# Patient Record
Sex: Female | Born: 1965 | Race: Black or African American | Hispanic: No | Marital: Married | Smoking: Current every day smoker
Health system: Southern US, Community
[De-identification: ages and names within clinical notes are randomized; demographics above are authoritative.]

---

## 2000-09-12 ENCOUNTER — Other Ambulatory Visit: Admission: RE | Admit: 2000-09-12 | Discharge: 2000-09-12 | Payer: Self-pay | Admitting: *Deleted

## 2002-01-04 ENCOUNTER — Encounter: Admission: RE | Admit: 2002-01-04 | Discharge: 2002-01-04 | Payer: Self-pay

## 2002-10-10 ENCOUNTER — Other Ambulatory Visit: Admission: RE | Admit: 2002-10-10 | Discharge: 2002-10-10 | Payer: Self-pay

## 2004-08-19 ENCOUNTER — Other Ambulatory Visit: Admission: RE | Admit: 2004-08-19 | Discharge: 2004-08-19 | Payer: Self-pay | Admitting: Family Medicine

## 2005-08-24 ENCOUNTER — Other Ambulatory Visit: Admission: RE | Admit: 2005-08-24 | Discharge: 2005-08-24 | Payer: Self-pay | Admitting: Family Medicine

## 2006-01-12 ENCOUNTER — Emergency Department (HOSPITAL_COMMUNITY): Admission: EM | Admit: 2006-01-12 | Discharge: 2006-01-12 | Payer: Self-pay | Admitting: Emergency Medicine

## 2006-08-12 ENCOUNTER — Emergency Department (HOSPITAL_COMMUNITY): Admission: EM | Admit: 2006-08-12 | Discharge: 2006-08-12 | Payer: Self-pay | Admitting: Emergency Medicine

## 2006-08-12 IMAGING — CR DG FINGER MIDDLE 2+V*R*
3 series · 3 of 3 positions shown · non-contrast
Comparison: none

CLINICAL DATA: Blunt trauma.  
 RIGHT THIRD DIGIT ? 3 VIEW:

[x finger pa right]
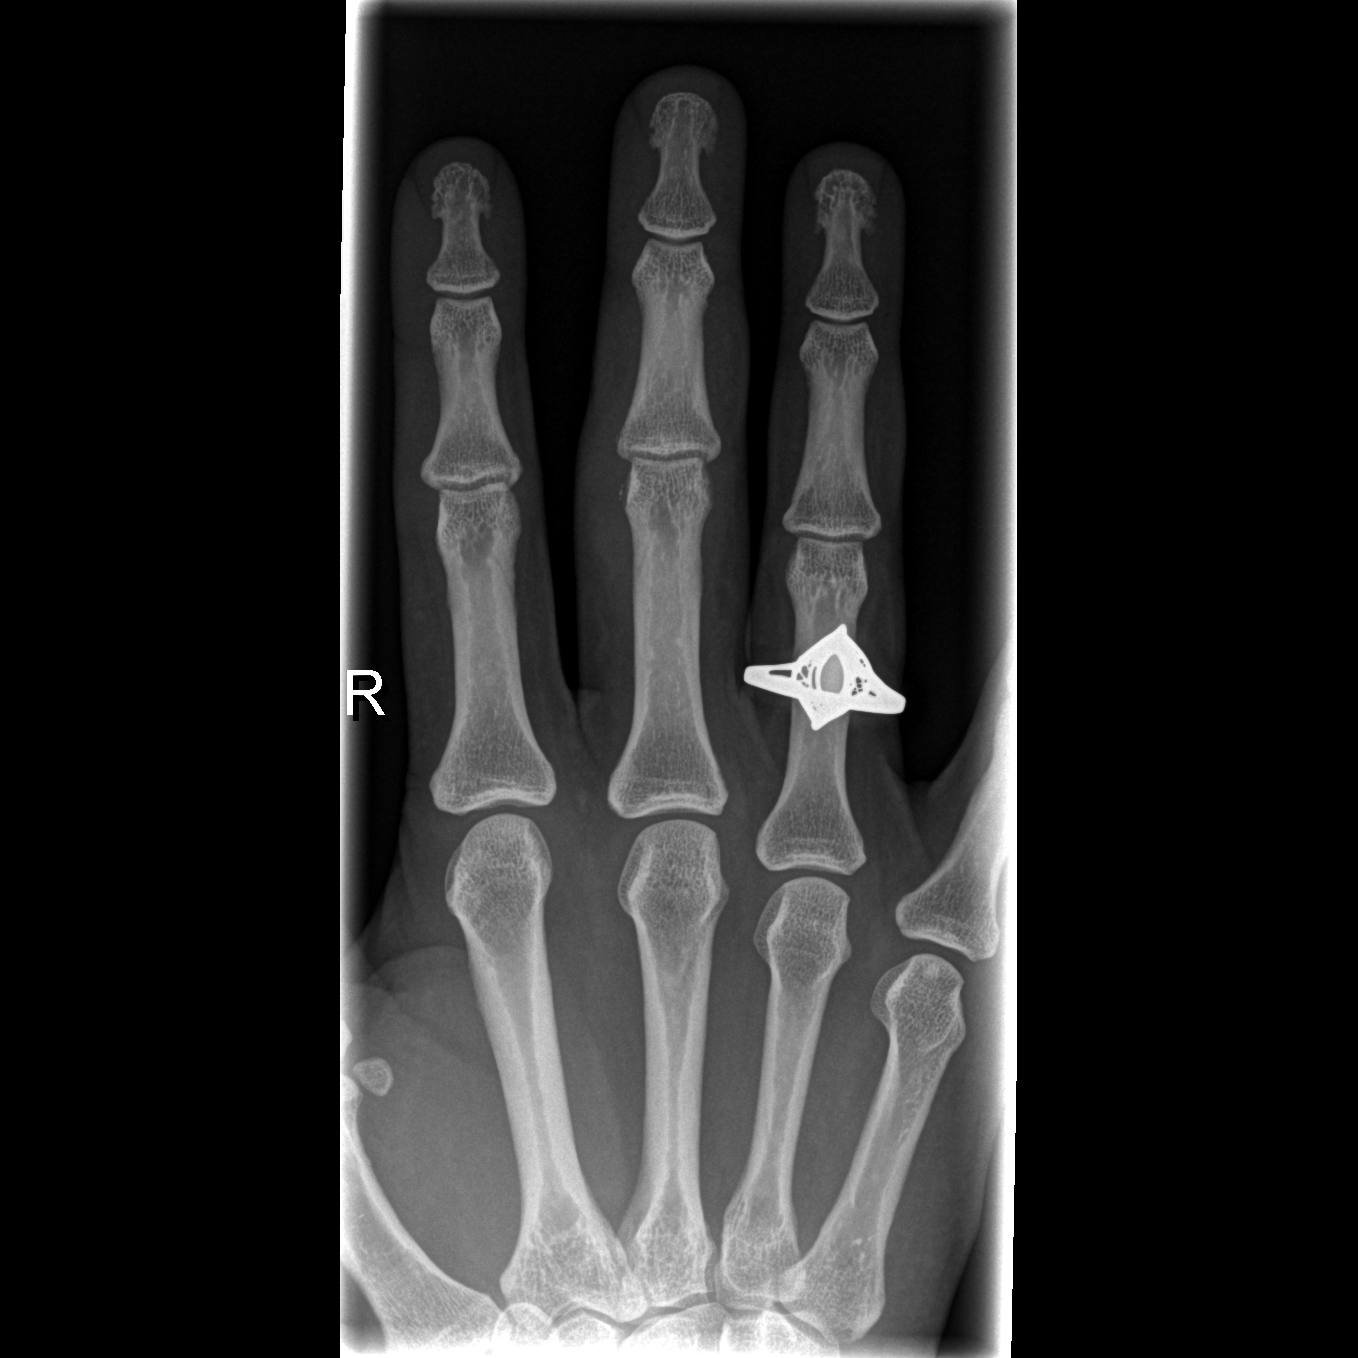

[x finger obl. right]
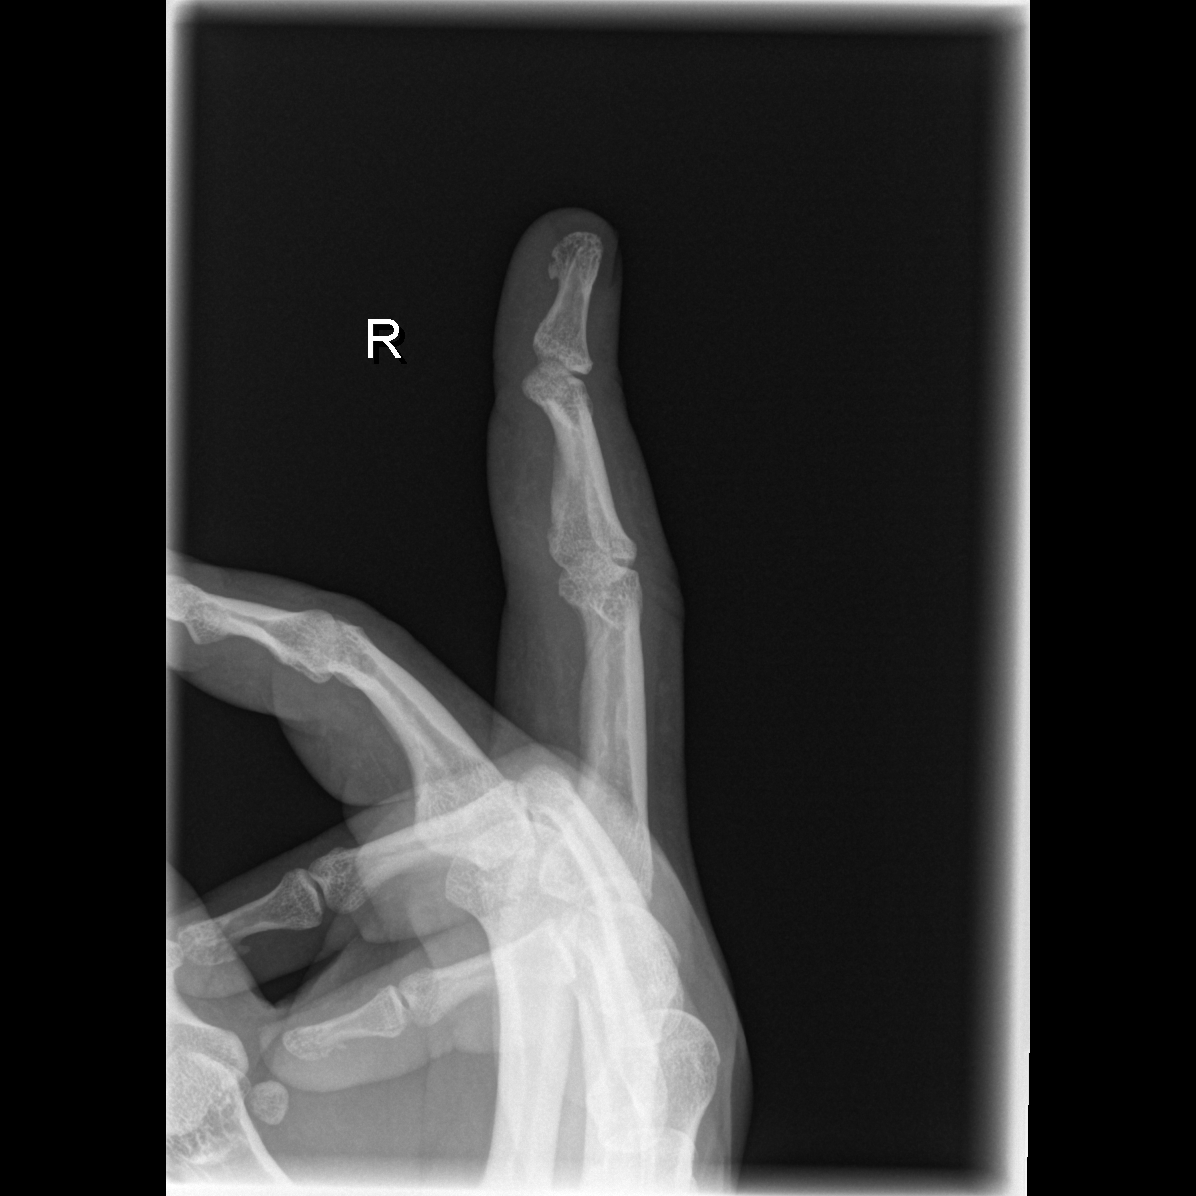

[x finger lateral right]
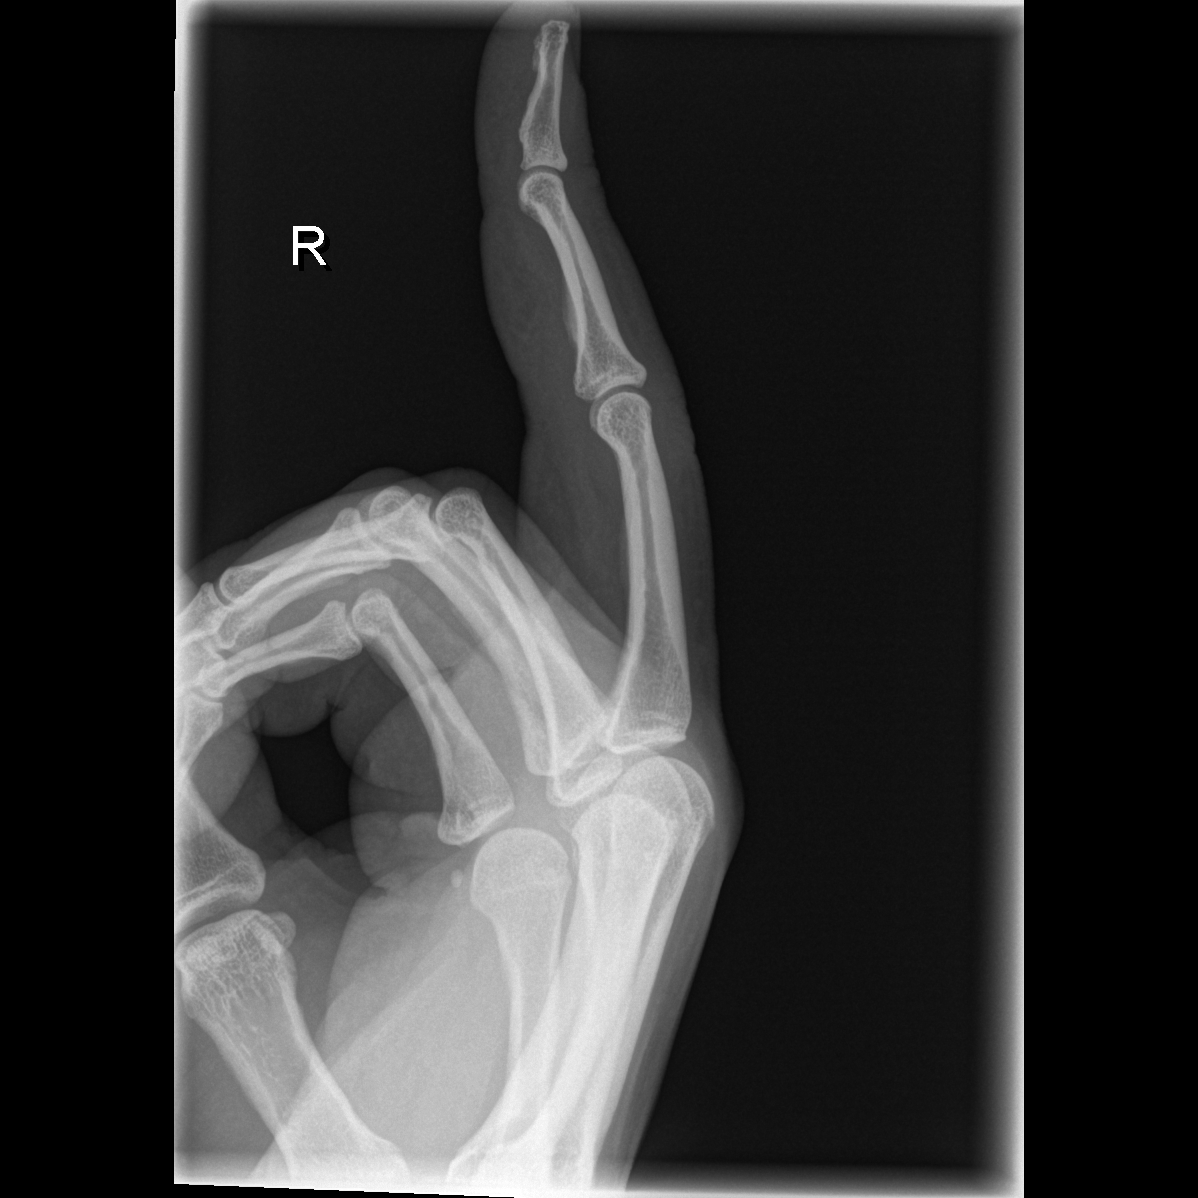

[3 of 3 positions shown; findings below may reference images not displayed]

FINDINGS: No acute fracture is seen.  Alignment is normal.
IMPRESSION: No fracture.

## 2007-09-08 ENCOUNTER — Emergency Department (HOSPITAL_COMMUNITY): Admission: EM | Admit: 2007-09-08 | Discharge: 2007-09-08 | Payer: Self-pay | Admitting: Emergency Medicine

## 2007-10-05 ENCOUNTER — Emergency Department (HOSPITAL_COMMUNITY): Admission: EM | Admit: 2007-10-05 | Discharge: 2007-10-06 | Payer: Self-pay | Admitting: Emergency Medicine

## 2007-10-06 IMAGING — CT CT ABDOMEN W/ CM
2 of 5 series · 17 of 46 positions shown, 19 images · IV contrast (APPLIED)
Comparison: None.

CT ABDOMEN

CLINICAL DATA: Right lower quadrant abdominal pain, nausea,
vomiting and diarrhea for the past 2 days.  History of bilateral
ovarian cysts.

CT ABDOMEN AND PELVIS WITH CONTRAST
TECHNIQUE: Multidetector CT imaging of the abdomen and pelvis was
performed using the standard protocol following bolus
administration of intravenous contrast.
Contrast: 80 ml [id number]

[Series 3: abd/pelv with 5.0 b31f st · axial · 0.83mm/px · z∈[+54,+464]mm · 14 of 92 slices shown, 16 images]
[im 5/92  soft-tissue]
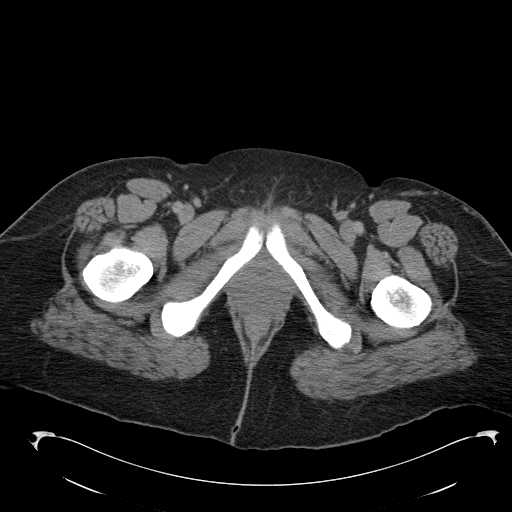
[im 5/92  bone]
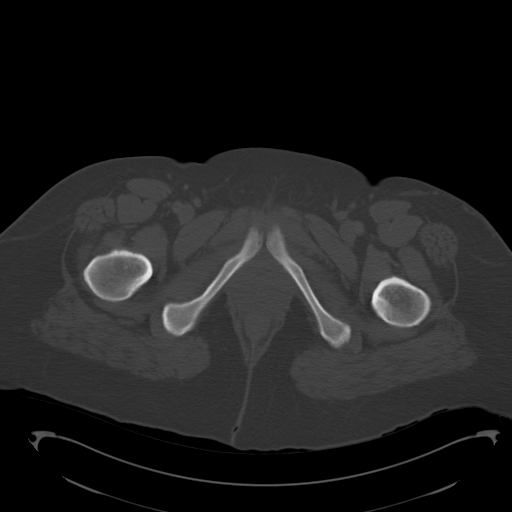
[im 10/92  soft-tissue]
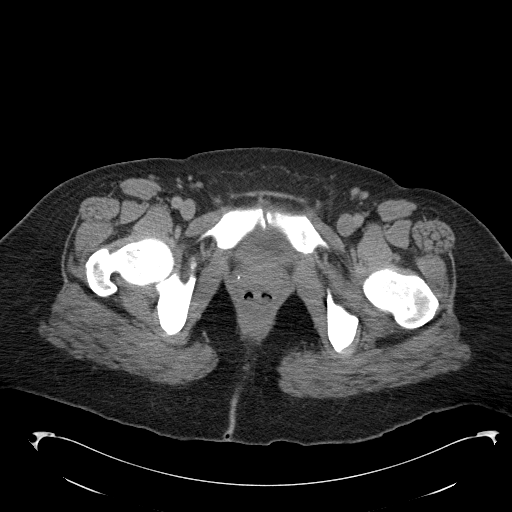
[im 20/92  soft-tissue]
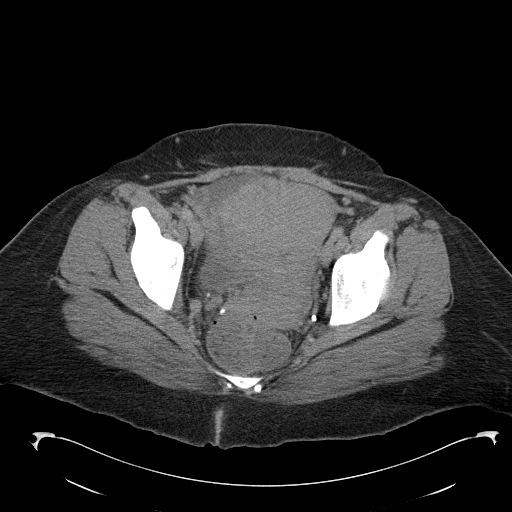
[im 24/92  soft-tissue]
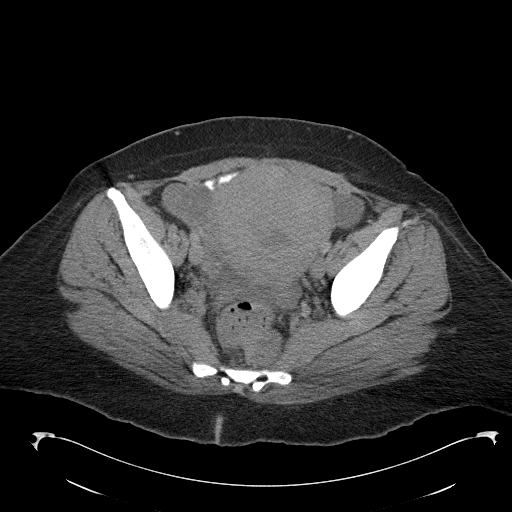
[im 29/92  soft-tissue]
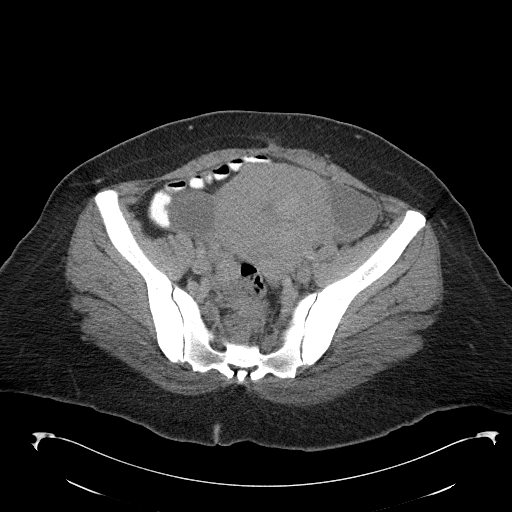
[im 39/92  soft-tissue]
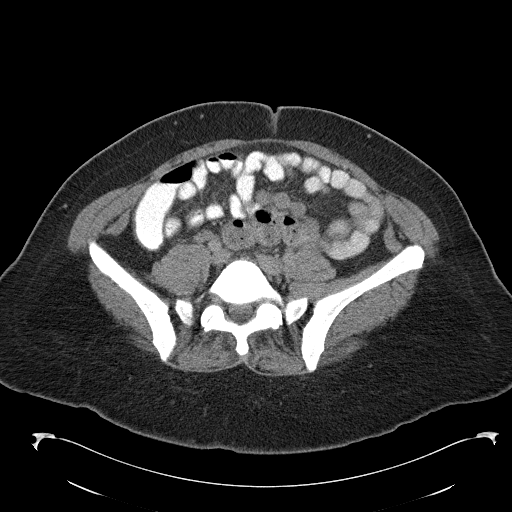
[im 44/92  soft-tissue]
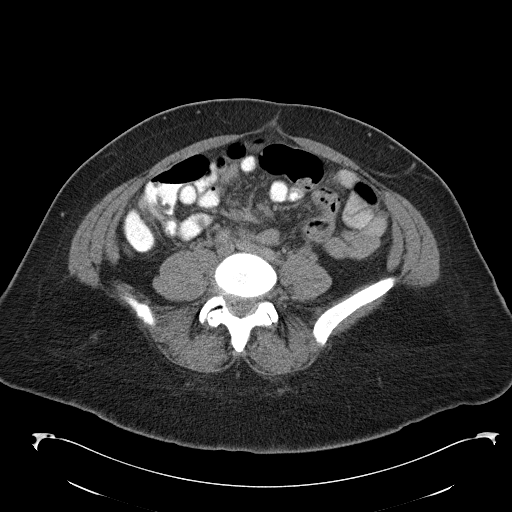
[im 48/92  soft-tissue]
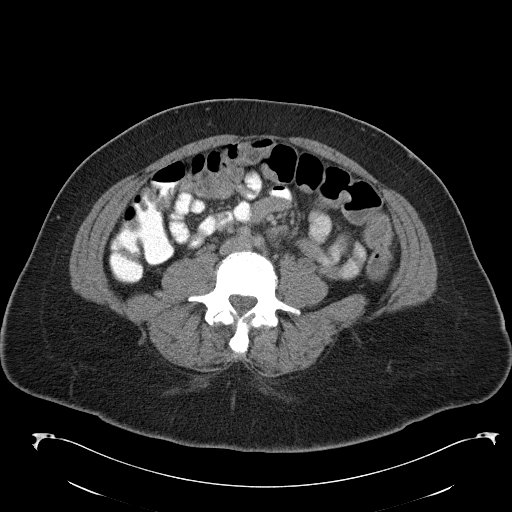
[im 53/92  soft-tissue]
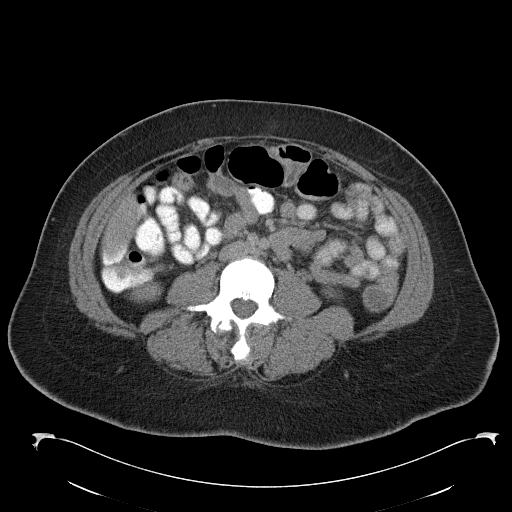
[im 53/92  bone]
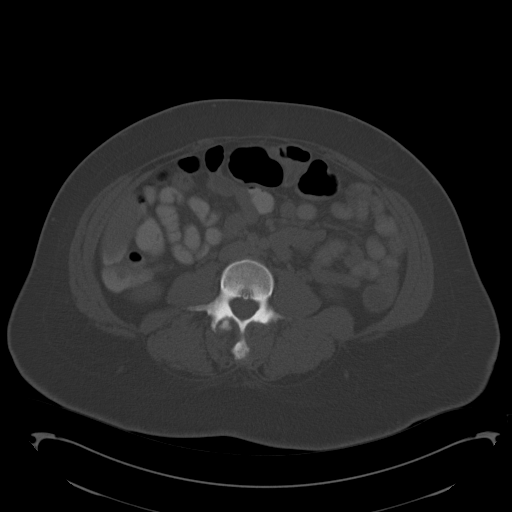
[im 63/92  soft-tissue]
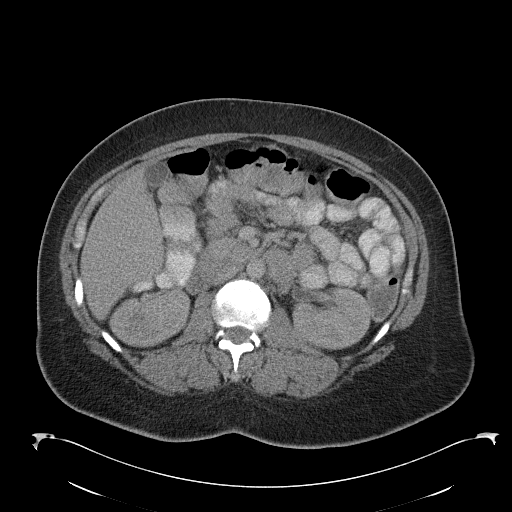
[im 68/92  soft-tissue]
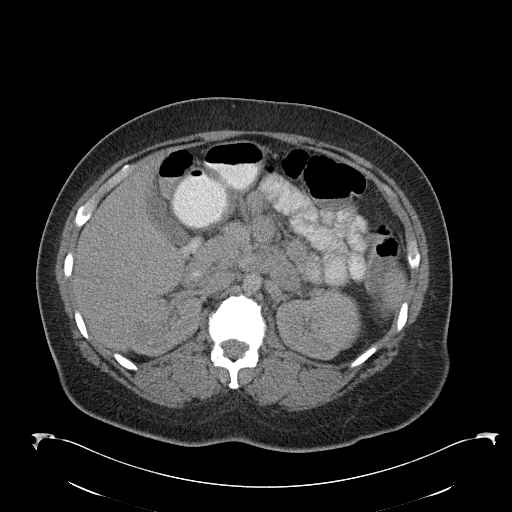
[im 72/92  soft-tissue]
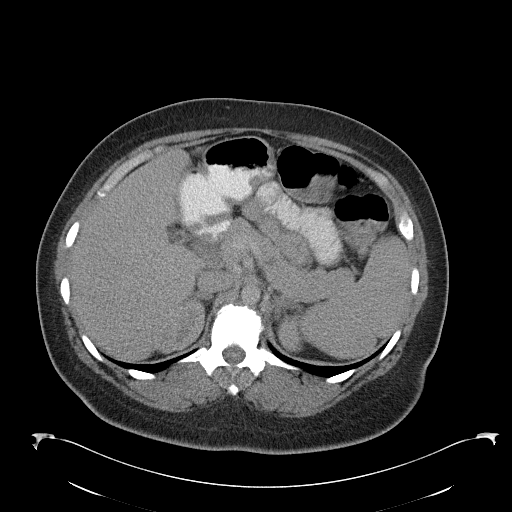
[im 82/92  soft-tissue]
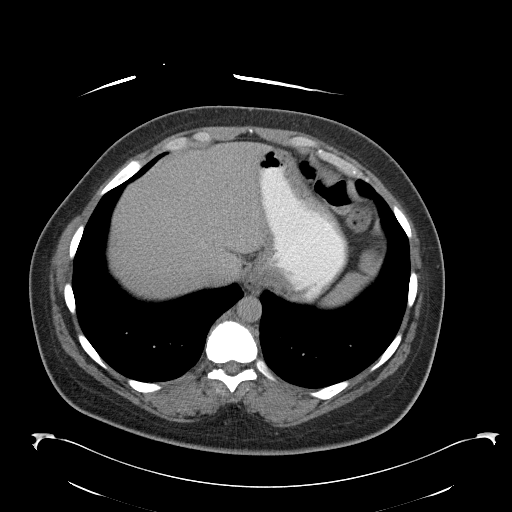
[im 87/92  soft-tissue]
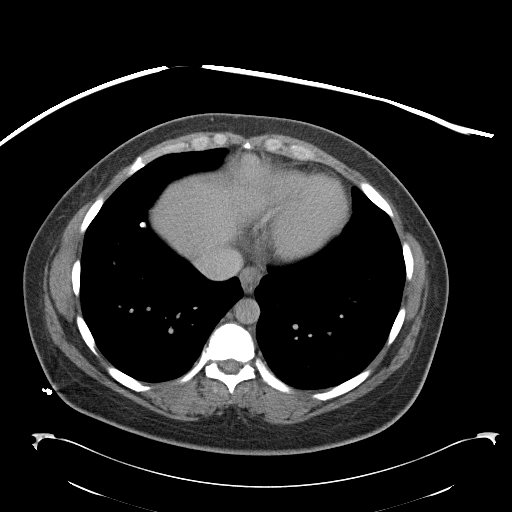

[Series 602: coronal a/p · coronal · 0.89mm/px · 3 of 72 slices shown]
[im 24/72  soft-tissue]
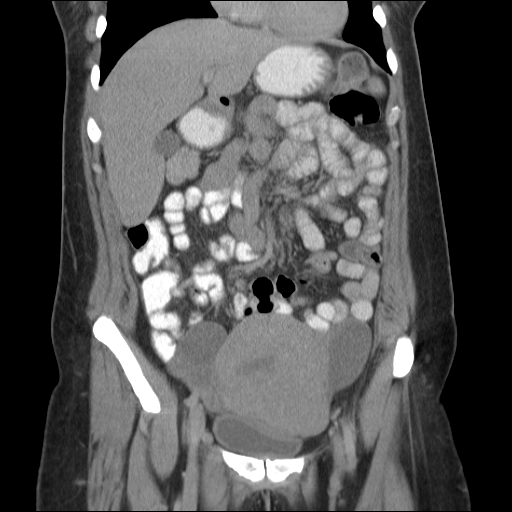
[im 32/72  soft-tissue]
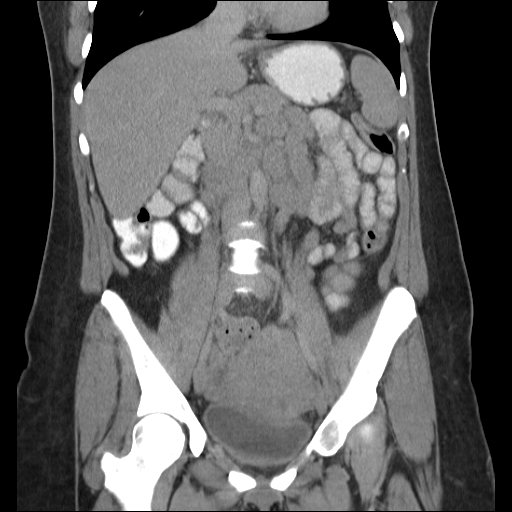
[im 40/72  soft-tissue]
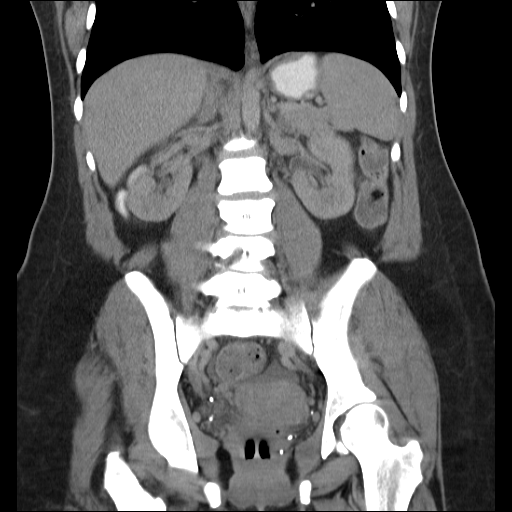

[17 of 46 positions shown; findings below may reference images not displayed]

FINDINGS: Normal appearing liver, spleen, pancreas, gallbladder,
kidneys and adrenal glands.  No gastrointestinal abnormalities or
enlarged lymph nodes.  Clear lung bases.  Mild lumbar and lower
thoracic spine degenerative changes.
IMPRESSION: No acute abnormality.

CT PELVIS
FINDINGS: Multiple bilateral ovarian cysts.  These include two
small recently collapsed right ovarian cysts as well as a 4.5 cm
simple appearing right ovarian cyst.  There are also 5.3 cm and
cm simple appearing left ovarian cysts.  The uterus is diffusely
enlarged and mildly heterogeneous with a mildly prominent
endometrium.  A small amount of free peritoneal fluid is noted as
well as bilateral pelvic phleboliths.  Unremarkable bowel loops.
No evidence of appendicitis.  No enlarged lymph nodes.
Unremarkable bones.
IMPRESSION: 1.  Myomatous uterus.
2.  Multiple bilateral ovarian cysts, including two recently
collapsed right ovarian cysts.
3.  Small amount of free peritoneal fluid, compatible with recent
right ovarian cyst rupture.

## 2009-07-25 ENCOUNTER — Emergency Department (HOSPITAL_COMMUNITY): Admission: EM | Admit: 2009-07-25 | Discharge: 2009-07-25 | Payer: Self-pay | Admitting: Emergency Medicine

## 2009-12-28 ENCOUNTER — Emergency Department (HOSPITAL_COMMUNITY): Admission: EM | Admit: 2009-12-28 | Discharge: 2009-12-28 | Payer: Self-pay | Admitting: Emergency Medicine

## 2009-12-28 IMAGING — CR DG LUMBAR SPINE COMPLETE 4+V
5 series · 5 of 5 positions shown · non-contrast
Comparison: None.

CLINICAL DATA: Chronic low back pain.  Known injury.

LUMBAR SPINE - COMPLETE 4+ VIEW

[view not recorded (1 of 5)]
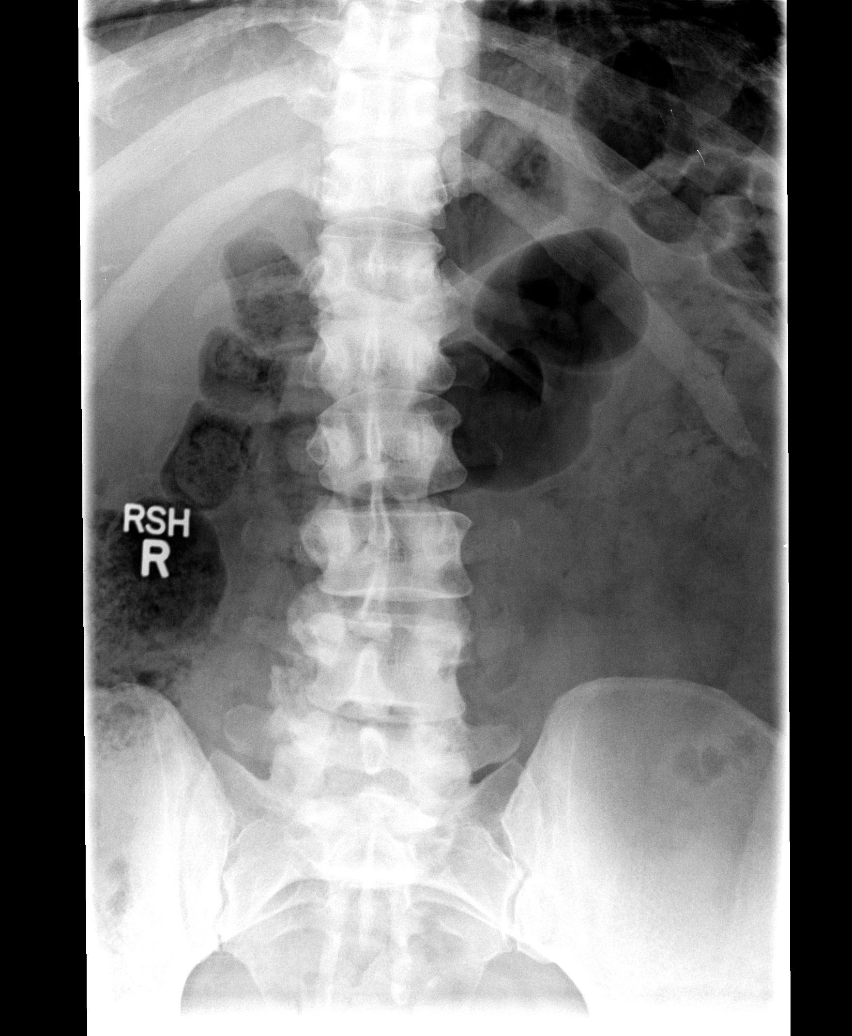

[view not recorded (2 of 5)]
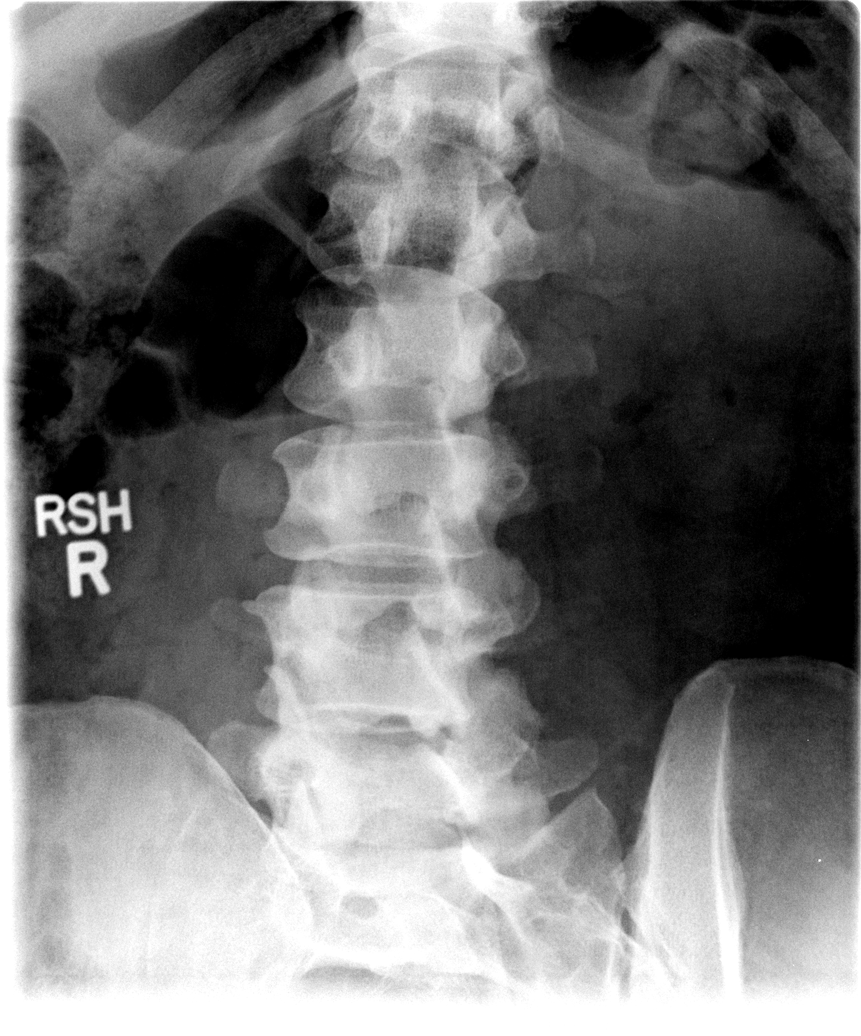

[view not recorded (3 of 5)]
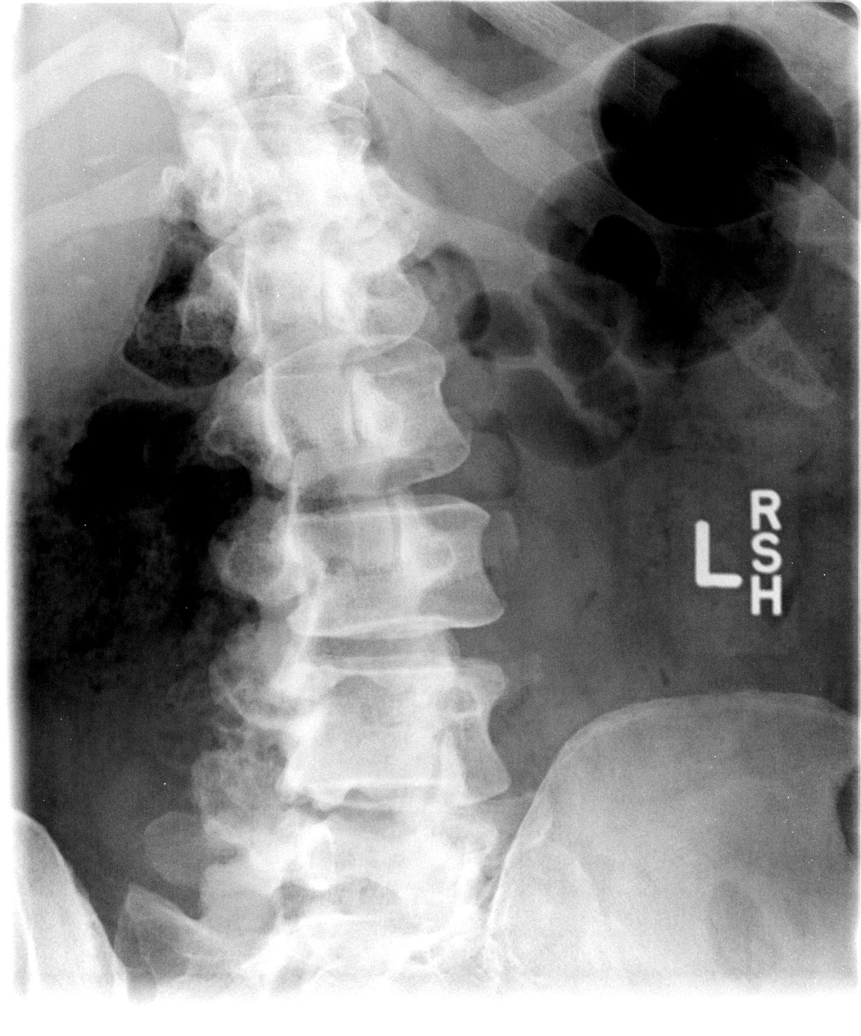

[view not recorded (4 of 5)]
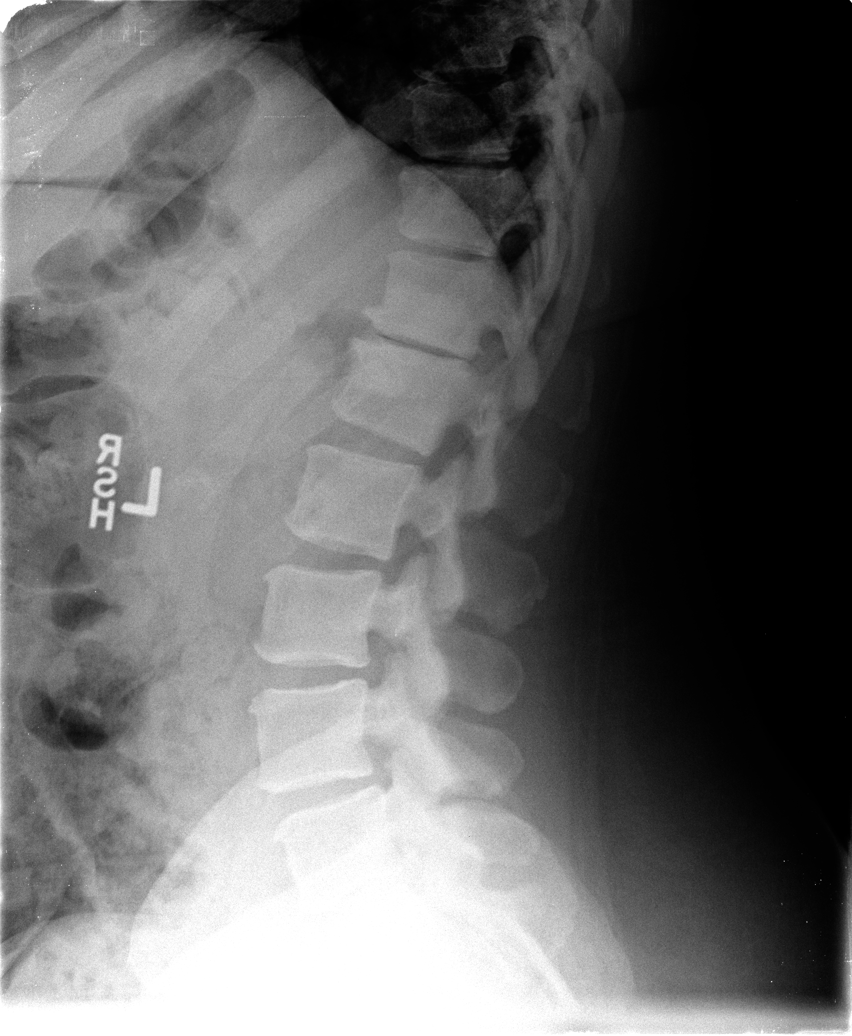

[view not recorded (5 of 5)]
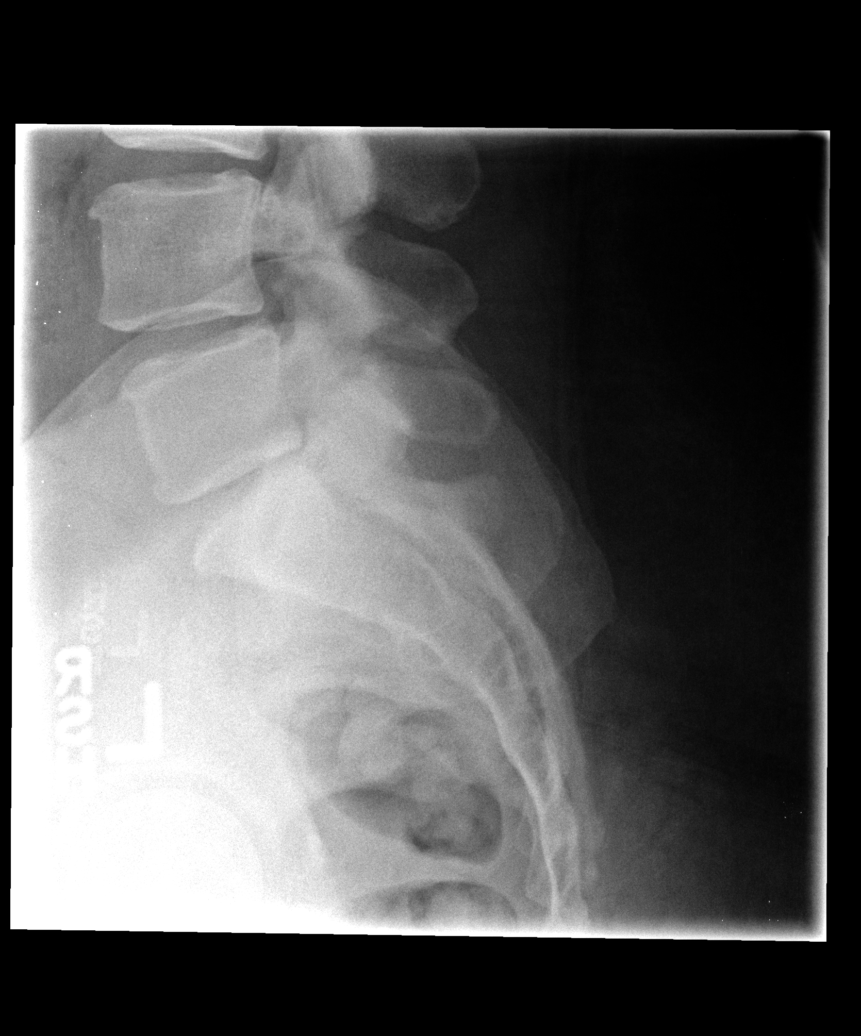

[5 of 5 positions shown; findings below may reference images not displayed]

FINDINGS: Degenerative disc space narrowing is noted T12-L1 level.
There are osteophytic changes at this level. There is also right L4-
5 facet joint arthritic change. There are no fractures,
subluxations, or destructive changes.
IMPRESSION: T12-L1 degenerative disc changes as discussed above. Right L4-5
facet joint arthritic changes.  No acute findings.

## 2010-02-10 ENCOUNTER — Emergency Department (HOSPITAL_COMMUNITY): Admission: EM | Admit: 2010-02-10 | Discharge: 2010-02-10 | Payer: Self-pay | Admitting: Family Medicine

## 2010-06-08 ENCOUNTER — Inpatient Hospital Stay (INDEPENDENT_AMBULATORY_CARE_PROVIDER_SITE_OTHER)
Admission: RE | Admit: 2010-06-08 | Discharge: 2010-06-08 | Disposition: A | Discharge status: Home / Self Care | Payer: PRIVATE HEALTH INSURANCE | Source: Ambulatory Visit | Attending: Emergency Medicine | Admitting: Emergency Medicine

## 2010-06-08 DIAGNOSIS — M545 Low back pain, unspecified: Secondary | ICD-10-CM

## 2010-06-22 LAB — POCT URINALYSIS DIPSTICK
Glucose, UA: NEGATIVE mg/dL
Nitrite: NEGATIVE
Protein, ur: NEGATIVE mg/dL
Urobilinogen, UA: 0.2 mg/dL (ref 0.0–1.0)
pH: 8.5 — ABNORMAL HIGH (ref 5.0–8.0)

## 2010-06-24 LAB — URINALYSIS, ROUTINE W REFLEX MICROSCOPIC
Bilirubin Urine: NEGATIVE
Glucose, UA: NEGATIVE mg/dL
Hgb urine dipstick: NEGATIVE
Ketones, ur: NEGATIVE mg/dL
Protein, ur: NEGATIVE mg/dL

## 2010-06-24 LAB — URINE CULTURE: Colony Count: >=100000

## 2010-06-24 LAB — POCT PREGNANCY, URINE: Preg Test, Ur: NEGATIVE

## 2010-06-29 LAB — URINALYSIS, ROUTINE W REFLEX MICROSCOPIC
Bilirubin Urine: NEGATIVE
Ketones, ur: NEGATIVE mg/dL
Nitrite: NEGATIVE
Protein, ur: NEGATIVE mg/dL

## 2010-12-03 ENCOUNTER — Inpatient Hospital Stay (INDEPENDENT_AMBULATORY_CARE_PROVIDER_SITE_OTHER)
Admission: RE | Admit: 2010-12-03 | Discharge: 2010-12-03 | Disposition: A | Discharge status: Home / Self Care | Payer: PRIVATE HEALTH INSURANCE | Source: Ambulatory Visit | Attending: Emergency Medicine | Admitting: Emergency Medicine

## 2010-12-03 DIAGNOSIS — M549 Dorsalgia, unspecified: Secondary | ICD-10-CM

## 2011-01-06 LAB — DIFFERENTIAL
Basophils Absolute: 0
Basophils Relative: 0
Eosinophils Relative: 4
Lymphocytes Relative: 11 — ABNORMAL LOW
Monocytes Absolute: 0.5
Monocytes Relative: 4

## 2011-01-06 LAB — ABO/RH: ABO/RH(D): O POS

## 2011-01-06 LAB — URINALYSIS, ROUTINE W REFLEX MICROSCOPIC
Nitrite: POSITIVE — AB
Protein, ur: 30 — AB
Specific Gravity, Urine: 1.02
Urobilinogen, UA: 0.2

## 2011-01-06 LAB — CBC
HCT: 25.3 — ABNORMAL LOW
Hemoglobin: 7.5 — CL
MCHC: 29.8 — ABNORMAL LOW
Platelets: 442 — ABNORMAL HIGH
RBC: 4.56
RDW: 20.6 — ABNORMAL HIGH

## 2011-01-06 LAB — TYPE AND SCREEN: Antibody Screen: NEGATIVE

## 2011-01-06 LAB — POCT I-STAT, CHEM 8
BUN: 6
Creatinine, Ser: 1
Glucose, Bld: 110 — ABNORMAL HIGH
Hemoglobin: 9.5 — ABNORMAL LOW
Potassium: 4
Sodium: 140

## 2011-01-06 LAB — URINE MICROSCOPIC-ADD ON

## 2011-03-02 ENCOUNTER — Other Ambulatory Visit: Payer: Self-pay | Admitting: Family Medicine

## 2011-03-02 DIAGNOSIS — Z1231 Encounter for screening mammogram for malignant neoplasm of breast: Secondary | ICD-10-CM

## 2011-03-30 ENCOUNTER — Ambulatory Visit: Payer: PRIVATE HEALTH INSURANCE

## 2011-06-11 ENCOUNTER — Emergency Department (HOSPITAL_COMMUNITY)
Admission: EM | Admit: 2011-06-11 | Discharge: 2011-06-11 | Disposition: A | Discharge status: Home / Self Care | Payer: Medicaid Other | Source: Home / Self Care | Attending: Family Medicine | Admitting: Family Medicine

## 2011-06-11 ENCOUNTER — Encounter (HOSPITAL_COMMUNITY): Payer: Self-pay | Admitting: Emergency Medicine

## 2011-06-11 DIAGNOSIS — K0889 Other specified disorders of teeth and supporting structures: Secondary | ICD-10-CM

## 2011-06-11 DIAGNOSIS — K089 Disorder of teeth and supporting structures, unspecified: Secondary | ICD-10-CM

## 2011-06-11 MED ORDER — HYDROCODONE-ACETAMINOPHEN 5-325 MG PO TABS: 1.0000 | ORAL_TABLET | ORAL | Status: AC | PRN

## 2011-06-11 NOTE — ED Provider Notes (Signed)
History     CSN: [id number]  Arrival date & time 06/11/11  1021   First MD Initiated Contact with Patient 06/11/11 1026      Chief Complaint  Patient presents with  . Dental Pain    (Consider location/radiation/quality/duration/timing/severity/associated sxs/prior treatment) Patient is a 47 y.o. female presenting with tooth pain. The history is provided by the patient.  Dental PainThe primary symptoms include mouth pain and sore throat. Primary symptoms do not include fever. The symptoms began yesterday (had 7 dental extractions yest, rx not given.,does have abx.). The symptoms are unchanged.  The sore throat is accompanied by trouble swallowing.  Additional symptoms include: gum swelling, gum tenderness and trouble swallowing.    History reviewed. No pertinent past medical history.  Past Surgical History  Procedure Date  . Cesarean section     No family history on file.  History  Substance Use Topics  . Smoking status: Current Everyday Smoker  . Smokeless tobacco: Not on file  . Alcohol Use: Yes    OB History    Grav Para Term Preterm Abortions TAB SAB Ect Mult Living                  Review of Systems  Constitutional: Negative.  Negative for fever.  HENT: Positive for sore throat, trouble swallowing and dental problem.     Allergies  Penicillins  Home Medications   Current Outpatient Rx  Name Route Sig Dispense Refill  . ACETAMINOPHEN 325 MG PO TABS Oral Take 650 mg by mouth every 6 (six) hours as needed.    . ALEVE PO Oral Take by mouth.    Marland Kitchen HYDROCODONE-ACETAMINOPHEN 5-325 MG PO TABS Oral Take 1 tablet by mouth every 4 (four) hours as needed for pain. 12 tablet 0    BP 133/85  Pulse 75  Temp(Src) 98.4 F (36.9 C) (Oral)  Resp 17  SpO2 97%  LMP 05/14/2011  Physical Exam  Nursing note and vitals reviewed. Constitutional: She appears well-developed and well-nourished.  HENT:  Head: Normocephalic.  Mouth/Throat:      ED Course  Procedures  (including critical care time)  Labs Reviewed - No data to display No results found.   1. Pain, dental       MDM          Barkley Bruns, MD 06/11/11 1135

## 2011-06-11 NOTE — Discharge Instructions (Signed)
Contact your dentist on mon if further problems.

## 2011-06-11 NOTE — ED Notes (Signed)
C/o dental pain.  7 teeth pulled --teeth pulled yesterday by dr Tristan Schroeder.  [facility] and no script called.  By the time this was realized, dental office had closed.  Reports left messages on answering machine, but no response.

## 2011-12-10 ENCOUNTER — Emergency Department (HOSPITAL_COMMUNITY)
Admission: EM | Admit: 2011-12-10 | Discharge: 2011-12-10 | Disposition: A | Discharge status: Home / Self Care | Payer: Medicaid Other | Attending: Emergency Medicine | Admitting: Emergency Medicine

## 2011-12-10 ENCOUNTER — Encounter (HOSPITAL_COMMUNITY): Payer: Self-pay | Admitting: *Deleted

## 2011-12-10 DIAGNOSIS — M62830 Muscle spasm of back: Secondary | ICD-10-CM

## 2011-12-10 DIAGNOSIS — F17200 Nicotine dependence, unspecified, uncomplicated: Secondary | ICD-10-CM | POA: Insufficient documentation

## 2011-12-10 DIAGNOSIS — M5380 Other specified dorsopathies, site unspecified: Secondary | ICD-10-CM | POA: Insufficient documentation

## 2011-12-10 MED ORDER — OXYCODONE-ACETAMINOPHEN 5-325 MG PO TABS: 1.0000 | ORAL_TABLET | Freq: Four times a day (QID) | ORAL | Status: AC | PRN

## 2011-12-10 MED ORDER — PREDNISONE 20 MG PO TABS: 40.0000 mg | ORAL_TABLET | Freq: Every day | ORAL | Status: DC

## 2011-12-10 MED ORDER — CYCLOBENZAPRINE HCL 10 MG PO TABS: 10.0000 mg | ORAL_TABLET | Freq: Two times a day (BID) | ORAL | Status: AC | PRN

## 2011-12-10 NOTE — ED Notes (Signed)
Pt was seen here a few months ago for back pain.  An MRI or CT was done and she was referred to [facility], but they are closed.  She has been doing a lot of heavy lifting for her job recently and the pain has returned. Some numbness to L leg.

## 2011-12-10 NOTE — ED Provider Notes (Signed)
History     CSN: [id number]  Arrival date & time 12/10/11  4098   First MD Initiated Contact with Patient 12/10/11 [phone]      Chief Complaint  Patient presents with  . Back Pain    lumbar region    (Consider location/radiation/quality/duration/timing/severity/associated sxs/prior treatment) HPI  Pt to the ER with complaints of back pain. She states that she used to have problems with her back but had been doing well over the past 8 months. 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 She says that the first few days she was very sore and her symptoms have continued to progress to wear when she wakes up in the morning she is very stiff. The symptoms started one week ago. She is ambulatory, she is able to bend over and tie her shoes. She denies loss of bowel and urine. Pt denies N/V/D, dysuria, vaginal discharge and fevers.  History reviewed. No pertinent past medical history.  Past Surgical History  Procedure Date  . Cesarean section     No family history on file.  History  Substance Use Topics  . Smoking status: Current Everyday Smoker -- 2 years  . Smokeless tobacco: Not on file  . Alcohol Use: Yes     occasionally    OB History    Grav Para Term Preterm Abortions TAB SAB Ect Mult Living                  Review of Systems   HEENT: denies blurry vision or change in hearing PULMONARY: Denies difficulty breathing and SOB CARDIAC: denies chest pain or heart palpitations MUSCULOSKELETAL:  denies being unable to ambulate ABDOMEN AL: denies abdominal pain GU: denies loss of bowel or urinary control NEURO: denies numbness and tingling in extremities SKIN: no new rashes PSYCH: patient denies anxiety or depression. NECK: Pt denies having neck pain     Allergies  Penicillins  Home Medications   Current Outpatient Rx  Name Route Sig  Dispense Refill  . ACETAMINOPHEN 325 MG PO TABS Oral Take 650 mg by mouth every 6 (six) hours as needed. For pain    . IBUPROFEN 200 MG PO TABS Oral Take 200 mg by mouth every 6 (six) hours as needed. For pain    . ALEVE PO Oral Take 1 tablet by mouth 2 (two) times daily.     . CYCLOBENZAPRINE HCL 10 MG PO TABS Oral Take 1 tablet (10 mg total) by mouth 2 (two) times daily as needed for muscle spasms. 20 tablet 0  . OXYCODONE-ACETAMINOPHEN 5-325 MG PO TABS Oral Take 1 tablet by mouth every 6 (six) hours as needed for pain. 15 tablet 0  . PREDNISONE 20 MG PO TABS Oral Take 2 tablets (40 mg total) by mouth daily. 10 tablet 0    BP 124/78  Pulse 85  Temp 97.9 F (36.6 C) (Oral)  Resp 18  SpO2 100%  Physical Exam  Nursing note and vitals reviewed. Constitutional: She appears well-developed and well-nourished. No distress.  HENT:  Head: Normocephalic and atraumatic.  Eyes: Pupils are equal, round, and reactive to light.  Neck: Normal range of motion. Neck supple.  Cardiovascular: Normal rate and regular rhythm.   Pulmonary/Chest: Effort normal.  Abdominal: Soft.  Musculoskeletal:       Back:  Equal strength to bilateral lower extremities. Neurosensory function adequate to both legs. Skin color is normal. Skin is warm and moist. I see no step off deformity, no bony tenderness. Pt is able to ambulate without limp. Pain is relieved when sitting in certain positions. ROM is decreased due to pain. No crepitus, laceration, effusion, swelling.  Pulses are normal   Neurological: She is alert.  Skin: Skin is warm and dry.      ED Course  Procedures (including critical care time)  Labs Reviewed - No data to display No results found.   1. Back spasm       MDM   Patient with back pain. No neurological deficits. Patient is ambulatory. No warning symptoms of back pain including: loss of bowel or bladder control, night sweats, waking from sleep with back pain, unexplained fevers  or weight loss, h/o cancer, IVDU, recent trauma. No concern for cauda equina, epidural abscess, or other serious cause of back pain. Conservative measures such as rest, ice/heat and pain medicine indicated with PCP follow-up if no improvement with conservative management.          Dorthula Matas, PA 12/10/11 [phone]

## 2011-12-10 NOTE — ED Notes (Signed)
Pt alert, amiable, interactive, NAD, calm, skin W&D, resps e/u, speaking in clear complete sentences, c/o mid to R low back pain, some radiation down R leg, (denies: nvd, fever, urinary or vaginal sx, bleeding, numbness, tingling, weakness, tripping or other sx), reports increased work load, long hours &recent heavy lifting, "opening a new store", onset of pain ~ 1-1.5 weeks ago, "gradually progressively worse".

## 2011-12-10 NOTE — ED Notes (Signed)
ED PA at [facility] 

## 2012-03-11 ENCOUNTER — Emergency Department (HOSPITAL_COMMUNITY)
Admission: EM | Admit: 2012-03-11 | Discharge: 2012-03-11 | Disposition: A | Discharge status: Home / Self Care | Payer: Medicaid Other | Attending: Emergency Medicine | Admitting: Emergency Medicine

## 2012-03-11 ENCOUNTER — Encounter (HOSPITAL_COMMUNITY): Payer: Self-pay | Admitting: Emergency Medicine

## 2012-03-11 DIAGNOSIS — R109 Unspecified abdominal pain: Secondary | ICD-10-CM | POA: Insufficient documentation

## 2012-03-11 DIAGNOSIS — M549 Dorsalgia, unspecified: Secondary | ICD-10-CM

## 2012-03-11 DIAGNOSIS — M79609 Pain in unspecified limb: Secondary | ICD-10-CM | POA: Insufficient documentation

## 2012-03-11 DIAGNOSIS — M545 Low back pain, unspecified: Secondary | ICD-10-CM | POA: Insufficient documentation

## 2012-03-11 DIAGNOSIS — F17200 Nicotine dependence, unspecified, uncomplicated: Secondary | ICD-10-CM | POA: Insufficient documentation

## 2012-03-11 DIAGNOSIS — Z79899 Other long term (current) drug therapy: Secondary | ICD-10-CM | POA: Insufficient documentation

## 2012-03-11 DIAGNOSIS — N898 Other specified noninflammatory disorders of vagina: Secondary | ICD-10-CM | POA: Insufficient documentation

## 2012-03-11 LAB — CBC WITH DIFFERENTIAL/PLATELET
Eosinophils Absolute: 0.4 10*3/uL (ref 0.0–0.7)
Eosinophils Relative: 5 % (ref 0–5)
HCT: 36 % (ref 36.0–46.0)
Hemoglobin: 12.3 g/dL (ref 12.0–15.0)
Lymphocytes Relative: 28 % (ref 12–46)
Lymphs Abs: 2.1 10*3/uL (ref 0.7–4.0)
MCH: 29.4 pg (ref 26.0–34.0)
MCV: 85.9 fL (ref 78.0–100.0)
Monocytes Relative: 6 % (ref 3–12)
Platelets: 238 10*3/uL (ref 150–400)
RBC: 4.19 MIL/uL (ref 3.87–5.11)
WBC: 7.7 10*3/uL (ref 4.0–10.5)

## 2012-03-11 LAB — BASIC METABOLIC PANEL
BUN: 8 mg/dL (ref 6–23)
CO2: 26 mEq/L (ref 19–32)
Calcium: 9.4 mg/dL (ref 8.4–10.5)
GFR calc non Af Amer: 81 mL/min — ABNORMAL LOW (ref >90)
Glucose, Bld: 98 mg/dL (ref 70–99)
Sodium: 141 mEq/L (ref 135–145)

## 2012-03-11 LAB — URINE MICROSCOPIC-ADD ON

## 2012-03-11 LAB — URINALYSIS, ROUTINE W REFLEX MICROSCOPIC
Bilirubin Urine: NEGATIVE
Glucose, UA: NEGATIVE mg/dL
Protein, ur: NEGATIVE mg/dL
Specific Gravity, Urine: 1.027 (ref 1.005–1.030)

## 2012-03-11 LAB — WET PREP, GENITAL: Yeast Wet Prep HPF POC: NONE SEEN

## 2012-03-11 MED ORDER — AZITHROMYCIN 250 MG PO TABS
1000.0000 mg | ORAL_TABLET | Freq: Once | ORAL | Status: AC
  Administered 2012-03-11: 1000 mg via ORAL
  Filled 2012-03-11: qty 4

## 2012-03-11 MED ORDER — IBUPROFEN 800 MG PO TABS: 800.0000 mg | ORAL_TABLET | Freq: Three times a day (TID) | ORAL | Status: DC

## 2012-03-11 MED ORDER — MORPHINE SULFATE 4 MG/ML IJ SOLN
6.0000 mg | Freq: Once | INTRAMUSCULAR | Status: AC
  Administered 2012-03-11: 6 mg via INTRAVENOUS
  Filled 2012-03-11 (×2): qty 1

## 2012-03-11 MED ORDER — OXYCODONE-ACETAMINOPHEN 5-325 MG PO TABS: ORAL_TABLET | ORAL | Status: DC

## 2012-03-11 MED ORDER — ONDANSETRON HCL 4 MG/2ML IJ SOLN
4.0000 mg | Freq: Once | INTRAMUSCULAR | Status: AC
  Administered 2012-03-11: 4 mg via INTRAVENOUS
  Filled 2012-03-11: qty 2

## 2012-03-11 NOTE — ED Provider Notes (Signed)
Medical screening examination/treatment/procedure(s) were performed by non-physician practitioner and as supervising physician I was immediately available for consultation/collaboration.   Rolan Bucco, MD 03/11/12 1520

## 2012-03-11 NOTE — ED Provider Notes (Signed)
History     CSN: [id number]  Arrival date & time 03/11/12  4098   First MD Initiated Contact with Patient 03/11/12 [phone]      No chief complaint on file.   (Consider location/radiation/quality/duration/timing/severity/associated sxs/prior treatment) HPI  Tammy Yang is a 46 y.o. female complaining of low back pain x2 days. Has not taken any pain Rx. Pain is 10/10, exacerbated by movement, radiates to the right groin. Pt has had prior similar episodes but not this severe. Denies trauma,  fever, change in bowel or bladder habits, numbness/paresthesia, history of IV drug use or cancer.  Patient endorses an abnormal vaginal discharge her for the last several days. She is actively menstruating at this time. 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   LMP 11/28  History reviewed. No pertinent past medical history.  Past Surgical History  Procedure Date  . Cesarean section     No family history on file.  History  Substance Use Topics  . Smoking status: Current Every Day Smoker -- 2 years  . Smokeless tobacco: Not on file  . Alcohol Use: Yes     Comment: occasionally    OB History    Grav Para Term Preterm Abortions TAB SAB Ect Mult Living                  Review of Systems  Constitutional: Negative for fever.  Respiratory: Negative for shortness of breath.   Cardiovascular: Negative for chest pain.  Gastrointestinal: Negative for nausea, vomiting, abdominal pain and diarrhea.  Genitourinary: Positive for vaginal discharge.  Musculoskeletal: Positive for back pain.  All other systems reviewed and are negative.    Allergies  Penicillins  Home Medications   Current Outpatient Rx  Name  Route  Sig  Dispense  Refill  . ACETAMINOPHEN 325 MG PO TABS   Oral   Take 650 mg by mouth every 6 (six) hours as needed. For pain         . IBUPROFEN 200 MG PO TABS   Oral   Take 200 mg by mouth every 6 (six) hours as needed. For pain         . ALEVE  PO   Oral   Take 1 tablet by mouth 2 (two) times daily.          Marland Kitchen PREDNISONE 20 MG PO TABS   Oral   Take 2 tablets (40 mg total) by mouth daily.   10 tablet   0     BP 120/74  Pulse 94  Temp 97.9 F (36.6 C) (Oral)  Resp 16  SpO2 99%  LMP 03/08/2012  Physical Exam  Nursing note and vitals reviewed. Constitutional: She is oriented to person, place, and time. She appears well-developed and well-nourished. No distress.       obese  HENT:  Head: Normocephalic.  Eyes: Conjunctivae normal and EOM are normal.  Cardiovascular: Normal rate.   Pulmonary/Chest: Effort normal and breath sounds normal. No stridor. No respiratory distress. She has no rales. She exhibits no tenderness.  Abdominal: Soft. Bowel sounds are normal. She exhibits no distension. There is no tenderness. There is no rebound.  Genitourinary: There is no lesion on the right labia. There is no lesion on the left labia. Cervix exhibits no motion tenderness. Right adnexum displays no mass, no tenderness and no fullness. Left adnexum displays no mass, no tenderness and no fullness.       Pelvic exam chaperoned by technician.  Large amount of dark blood pooled in the posterior fourchette a. No cervical or adnexal tenderness.  Musculoskeletal: Normal range of motion.       TTP of bilateral sacrum. Strait leg raise negative bilaterally.   Neurological: She is alert and oriented to person, place, and time.  Psychiatric: She has a normal mood and affect.    ED Course  Procedures (including critical care time)  Labs Reviewed  BASIC METABOLIC PANEL - Abnormal; Notable for the following:    GFR calc non Af Amer 81 (*)     All other components within normal limits  URINALYSIS, ROUTINE W REFLEX MICROSCOPIC - Abnormal; Notable for the following:    Hgb urine dipstick LARGE (*)     Leukocytes, UA SMALL (*)     All other components within normal limits  WET PREP, GENITAL - Abnormal; Notable for the following:    WBC, Wet  Prep HPF POC MODERATE (*)     All other components within normal limits  CBC WITH DIFFERENTIAL  URINE MICROSCOPIC-ADD ON  GC/CHLAMYDIA PROBE AMP   No results found.  9:02 AM pain is 5/10 after morphine  1. Back pain       MDM    Pelvic exam shows no acute abnormalities however the wet prep shows a moderate amount of white blood cells considering this I will treat the patient with 1 g of azithromycin. Patient has a true penicillin allergy with anaphylaxis for this reason I will defer treatment of gonorrhea at this time until [facility] and Chlamydia swabs return.   Pt verbalized understanding and agrees with care plan. Outpatient follow-up and return precautions given.    New Prescriptions   IBUPROFEN (ADVIL,MOTRIN) 800 MG TABLET    Take 1 tablet (800 mg total) by mouth 3 (three) times daily.   OXYCODONE-ACETAMINOPHEN (PERCOCET/ROXICET) 5-325 MG PER TABLET    1 to 2 tabs PO q6hrs  PRN for pain          Wynetta Emery, PA-C 03/11/12 1518

## 2012-03-11 NOTE — ED Notes (Signed)
Pt c/o low back pain that radiates to low abdominal area then down her right leg. Pt denies recent injury or N/V.

## 2012-05-20 ENCOUNTER — Emergency Department (HOSPITAL_COMMUNITY)
Admission: EM | Admit: 2012-05-20 | Discharge: 2012-05-20 | Disposition: A | Discharge status: Home / Self Care | Payer: Medicaid Other | Attending: Emergency Medicine | Admitting: Emergency Medicine

## 2012-05-20 ENCOUNTER — Emergency Department (HOSPITAL_COMMUNITY): Payer: Medicaid Other

## 2012-05-20 ENCOUNTER — Encounter (HOSPITAL_COMMUNITY): Payer: Self-pay | Admitting: Emergency Medicine

## 2012-05-20 DIAGNOSIS — F17200 Nicotine dependence, unspecified, uncomplicated: Secondary | ICD-10-CM | POA: Insufficient documentation

## 2012-05-20 DIAGNOSIS — D219 Benign neoplasm of connective and other soft tissue, unspecified: Secondary | ICD-10-CM

## 2012-05-20 DIAGNOSIS — D259 Leiomyoma of uterus, unspecified: Secondary | ICD-10-CM | POA: Insufficient documentation

## 2012-05-20 DIAGNOSIS — R109 Unspecified abdominal pain: Secondary | ICD-10-CM

## 2012-05-20 DIAGNOSIS — Z79899 Other long term (current) drug therapy: Secondary | ICD-10-CM | POA: Insufficient documentation

## 2012-05-20 LAB — URINALYSIS, ROUTINE W REFLEX MICROSCOPIC
Glucose, UA: NEGATIVE mg/dL
Nitrite: NEGATIVE
Specific Gravity, Urine: 1.028 (ref 1.005–1.030)
pH: 5 (ref 5.0–8.0)

## 2012-05-20 LAB — URINE MICROSCOPIC-ADD ON

## 2012-05-20 IMAGING — US US TRANSVAGINAL NON-OB
1 series · 13 of 25 positions shown · non-contrast
Comparison: CT 10/06/2007

CLINICAL DATA: Left side abdominal pain.  History of ovarian cyst.



[Series 1: us transvaginal non-ob · 0.30mm/px · 13 of 42 slices shown]
[im 1/42]
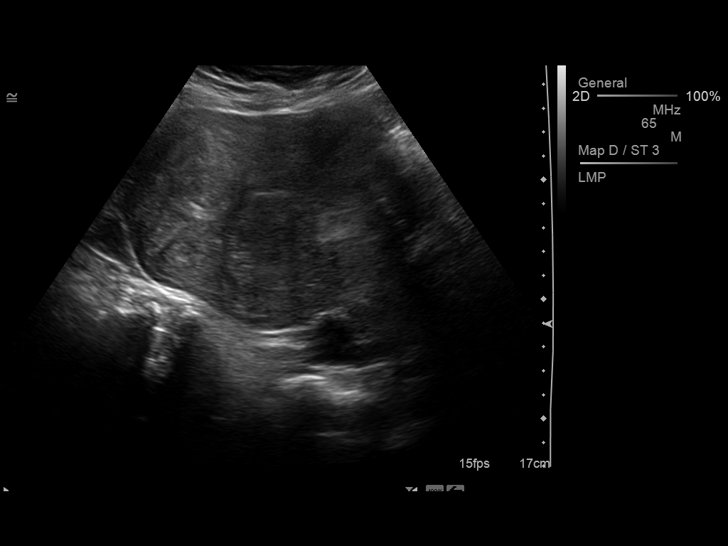
[im 4/42]
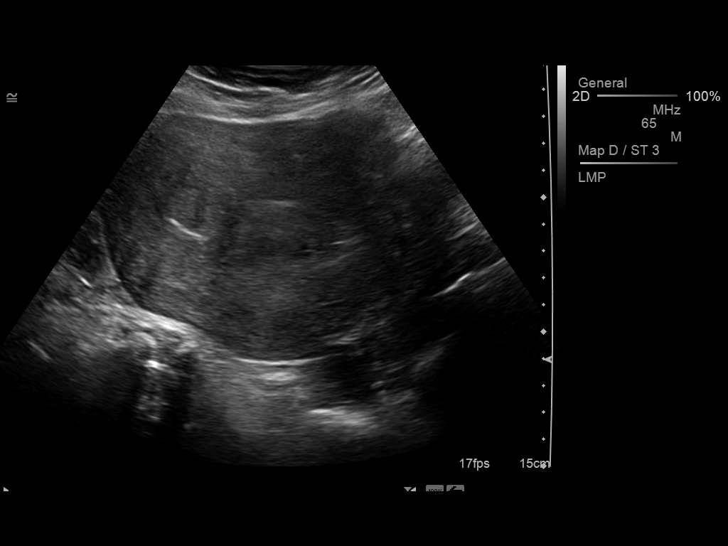
[im 7/42]
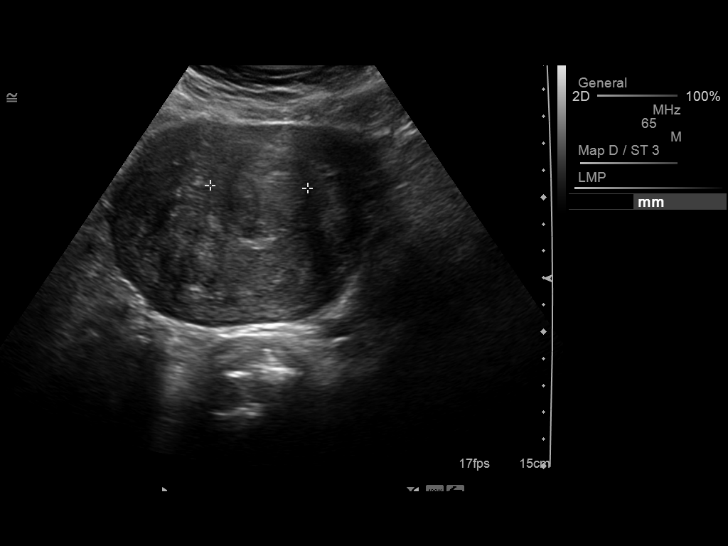
[im 11/42]
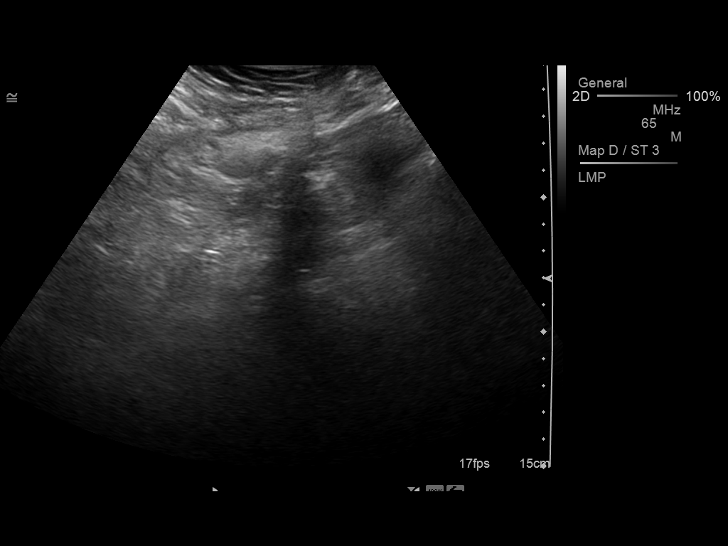
[im 14/42]
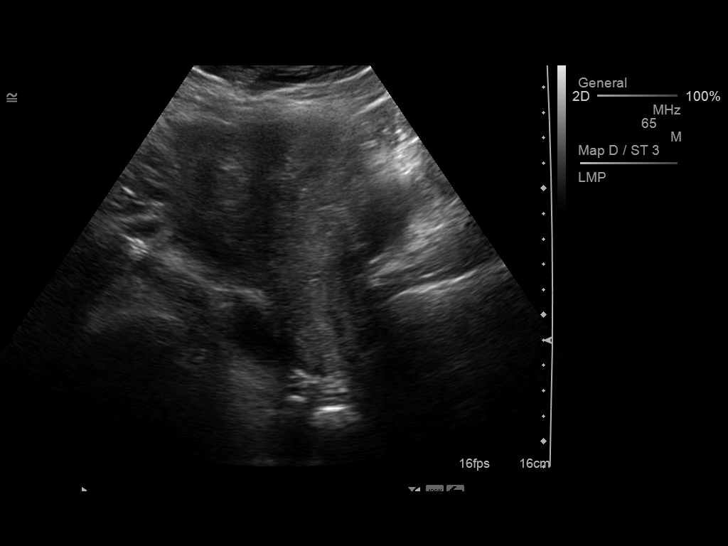
[im 18/42]
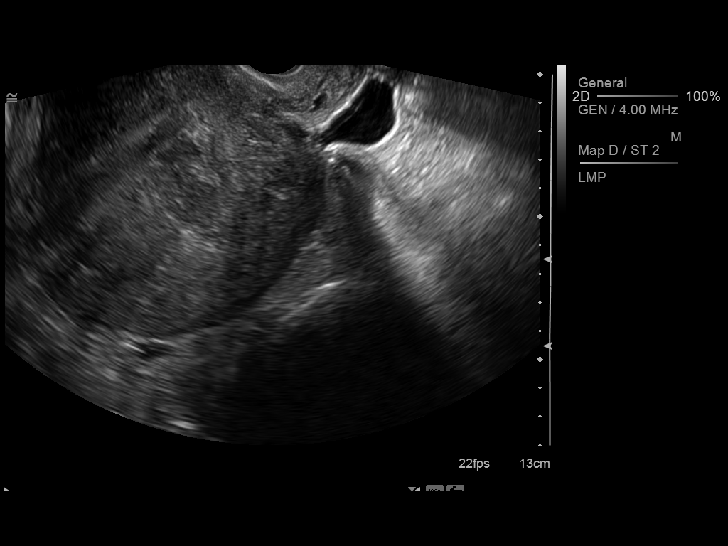
[im 21/42]
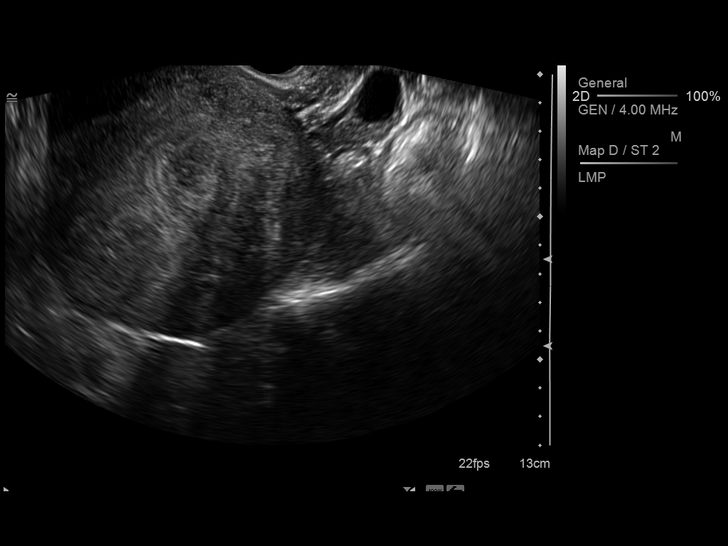
[im 24/42]
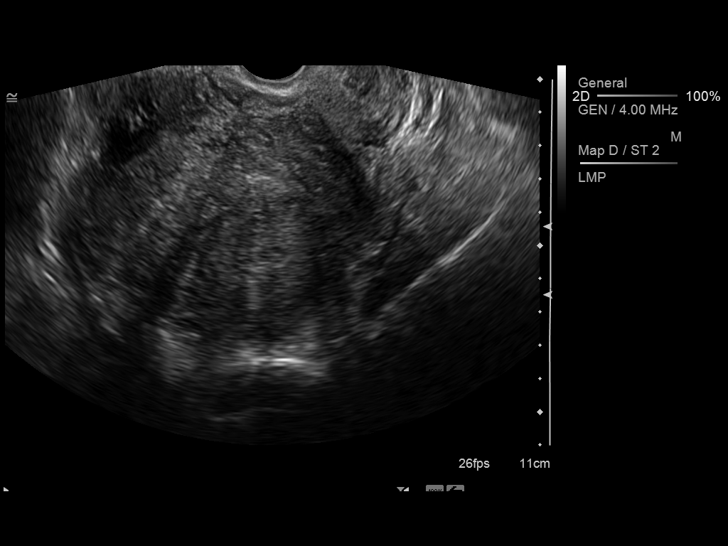
[im 28/42]
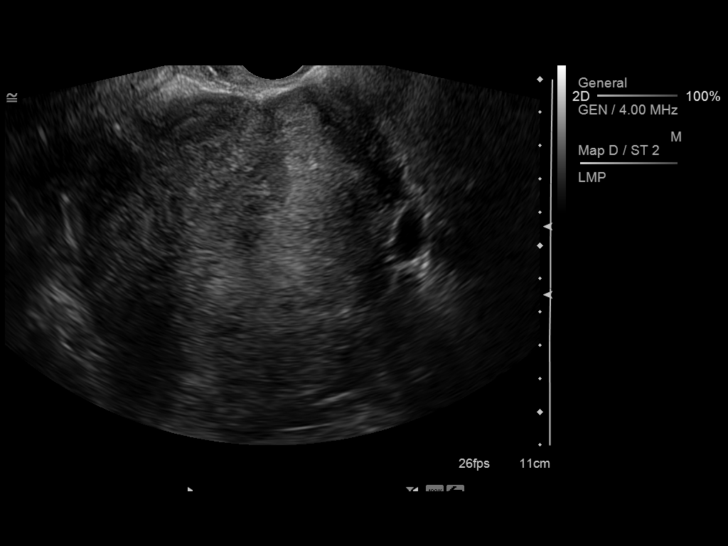
[im 31/42]
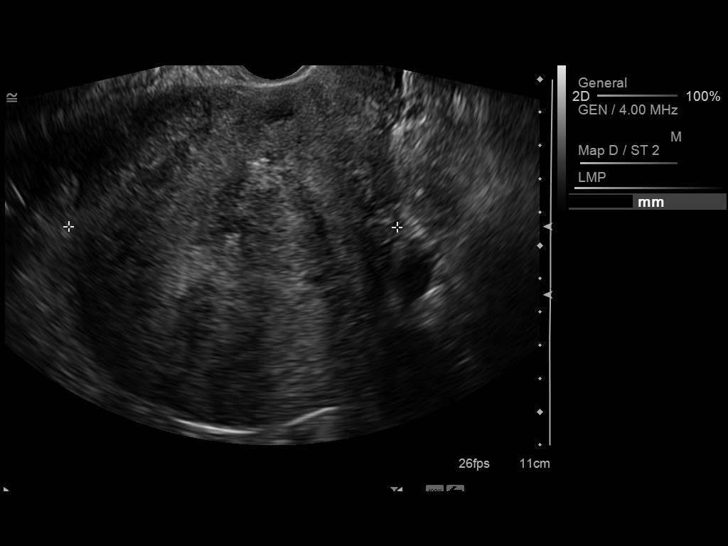
[im 35/42]
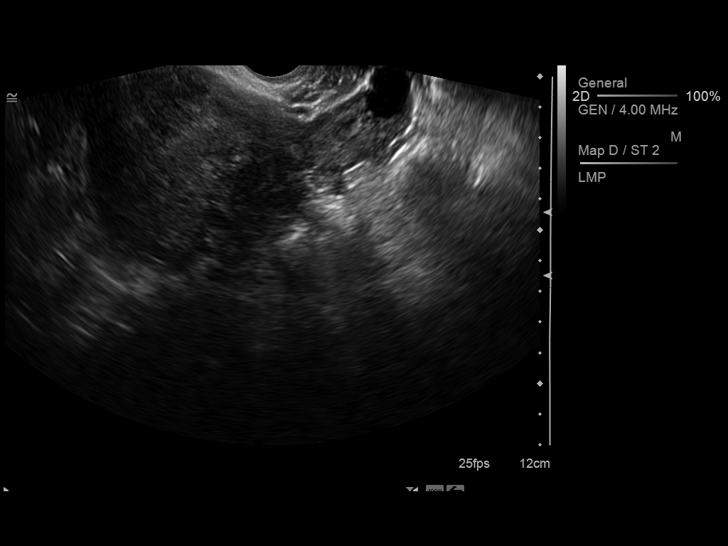
[im 38/42]
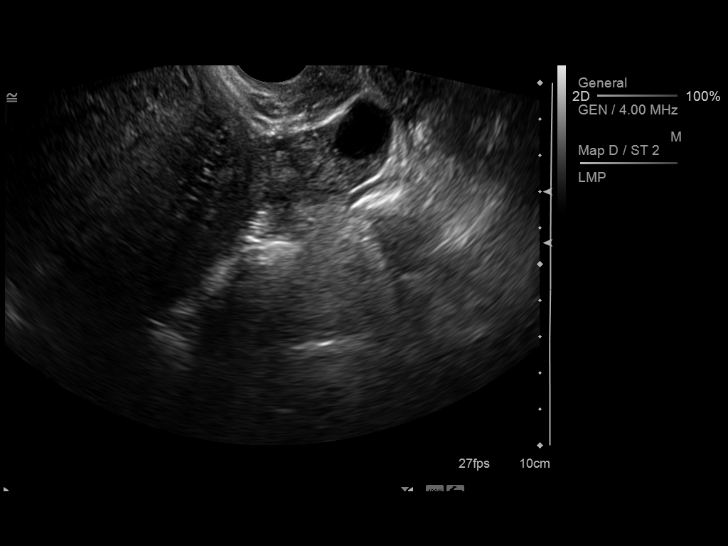
[im 42/42]
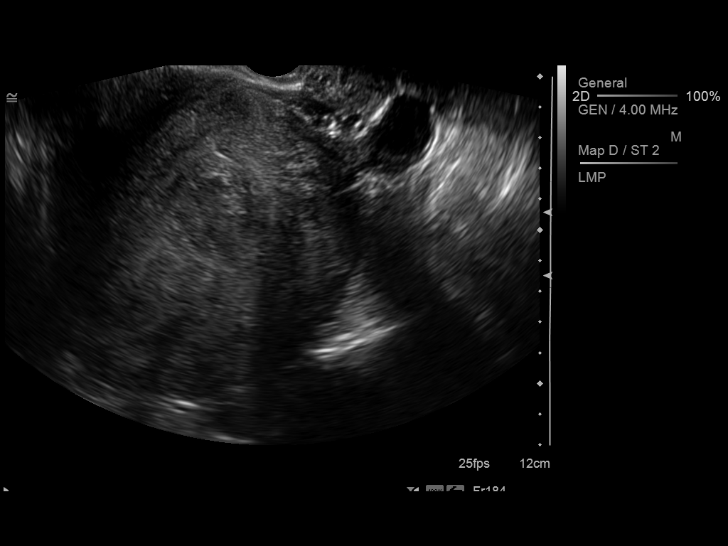

[13 of 25 positions shown; findings below may reference images not displayed]

FINDINGS: Uterus: Uterus is enlarged measuring 14.7 x 10.0 x 4.9 cm.  There
are two leiomyomas within the myometrium.  Largest measures 4.3 x
5.2 x 4.1 cm.  This leiomyoma is adjacent to the = endometrium and
difficult to tell if  sub mucosal or intramural.

Endometrium: Endometrium measures 14 mm which is normal for a
premenopausal female.

Right ovary:  Not identify

Left ovary: Normal in size of 4.8 x 2.0  x 2.3 cm.  There is a
anechoic thin-walled cyst with no significant flow measuring 2.0 x
1.6 x 1.8 cm.

Other findings: Trace amount free fluid
IMPRESSION: 1..  Two leiomyomas within an enlarged uterus.
2.  Potential submucosal leiomyoma.
3.  Endometrium measures 14 mm which is within normal limits for
premenopausal female.
4.  Dominant follicle within a normal left ovary.

5.  Right ovary not identified.  .

## 2012-05-20 MED ORDER — HYDROCODONE-ACETAMINOPHEN 5-325 MG PO TABS: 1.0000 | ORAL_TABLET | Freq: Four times a day (QID) | ORAL | Status: DC | PRN

## 2012-05-20 MED ORDER — IBUPROFEN 600 MG PO TABS: 600.0000 mg | ORAL_TABLET | Freq: Four times a day (QID) | ORAL | Status: AC | PRN

## 2012-05-20 MED ORDER — HYDROCODONE-ACETAMINOPHEN 5-325 MG PO TABS
2.0000 | ORAL_TABLET | Freq: Once | ORAL | Status: AC
  Administered 2012-05-20: 2 via ORAL
  Filled 2012-05-20: qty 2

## 2012-05-20 NOTE — ED Provider Notes (Signed)
History     CSN: [id number]  Arrival date & time 05/20/12  1191   First MD Initiated Contact with Patient 05/20/12 [phone]      Chief Complaint  Patient presents with  . Abdominal Pain    (Consider location/radiation/quality/duration/timing/severity/associated sxs/prior treatment) HPI Comments: 47 y/o female comes in with cc of abd pain. Pt started having abd pain, located in the LLQ 2 days ago. States that the abd pain is constant, pressure like pain, moderately intense, with no aggravating or reliving factors and no radiations. Pt has had similar pain in the past, and it was unclear as to the cause. She does endorse having some ovarian cyst in the past, that caused similar pain. She denies any hx of constipation or bloody stools. No n/v/f/c.  Patient is a 47 y.o. female presenting with abdominal pain. The history is provided by the patient.  Abdominal Pain Associated symptoms: no chest pain, no constipation, no cough, no diarrhea, no dysuria, no hematuria, no nausea, no shortness of breath and no vomiting     History reviewed. No pertinent past medical history.  Past Surgical History  Procedure Laterality Date  . Cesarean section      History reviewed. No pertinent family history.  History  Substance Use Topics  . Smoking status: Current Every Day Smoker -- 2 years  . Smokeless tobacco: Not on file  . Alcohol Use: Yes     Comment: occasionally    OB History   Grav Para Term Preterm Abortions TAB SAB Ect Mult Living                  Review of Systems  Constitutional: Negative for activity change.  HENT: Negative for facial swelling and neck pain.   Respiratory: Negative for cough, shortness of breath and wheezing.   Cardiovascular: Negative for chest pain.  Gastrointestinal: Positive for abdominal pain. Negative for nausea, vomiting, diarrhea, constipation, blood in stool and abdominal distention.  Genitourinary: Negative for dysuria, frequency, hematuria, flank pain  and difficulty urinating.  Skin: Negative for color change.  Neurological: Negative for speech difficulty.  Hematological: Does not bruise/bleed easily.  Psychiatric/Behavioral: Negative for confusion.    Allergies  Penicillins; Lactose intolerance (gi); Chocolate; and Eggs or egg-derived products  Home Medications   Current Outpatient Rx  Name  Route  Sig  Dispense  Refill  . cyclobenzaprine (FLEXERIL) 10 MG tablet   Oral   Take 10 mg by mouth 2 (two) times daily as needed for muscle spasms. Muscle spasms         . Fexofenadine HCl (ALLEGRA PO)   Oral   Take 1 tablet by mouth daily. For seasonal allergies           BP 122/81  Pulse 90  Temp(Src) 97.4 F (36.3 C) (Oral)  Resp 16  SpO2 100%  Physical Exam  Nursing note and vitals reviewed. Constitutional: She is oriented to person, place, and time. She appears well-developed and well-nourished.  HENT:  Head: Normocephalic and atraumatic.  Eyes: EOM are normal. Pupils are equal, round, and reactive to light.  Neck: Neck supple.  Cardiovascular: Normal rate, regular rhythm and normal heart sounds.   No murmur heard. Pulmonary/Chest: Effort normal. No respiratory distress.  Abdominal: Soft. She exhibits no distension. There is tenderness. There is no rebound and no guarding.  LLQ tenderness, worse with palpation, no rebound, guarding, no CVA tenderness.  Neurological: She is alert and oriented to person, place, and time.  Skin: Skin is  warm and dry.    ED Course  Procedures (including critical care time)  Labs Reviewed - No data to display No results found.   No diagnosis found.    MDM  DDx includes: Colitis Intra abdominal abscess Thrombosis Mesenteric ischemia Diverticulitis Nephrolithiasis Pyelonephritis UTI/Cystitis Ovarian cyst TOA  Pt comes in with cc of LLQ abd pain. Remote hx of ovarian cyst, and more recently, she had come to the ED with same complains - had a normal GU exam and  discharged home. Her pain started suddenly, and now she has constant pain, which when she is distracted, doesn't bother her much. She has no nausea, fevers, chills, vag discharge, bleeding. No hx of diverticular dz, constipations.  Pt has focal LLQ pain - but with hx of ocarian cyst  -we will get [place] to ensure there is no large cyst, or torsion. I dont think this is a case of UTI or renal stones. Will get UA, no plans of getting CT abd at this time.         Derwood Kaplan, MD 05/20/12 1035

## 2012-05-20 NOTE — ED Notes (Signed)
Pt c/o LLQ pain that feels like ovary per pt; pt sts currently on menstrual cycle but denies other discharge

## 2012-05-20 NOTE — ED Notes (Signed)
Pt transported to ultrasound.

## 2012-05-20 NOTE — ED Notes (Signed)
Pt presents to ED for evaluation of LLQ abdominal pain and lower back pain. Ongoing for several days. Denies urinary symptoms. 5/10 pain at the time. States she is currently on menstrual cycle at the time. Pt is alert and oriented x4.

## 2014-03-12 ENCOUNTER — Encounter (HOSPITAL_COMMUNITY): Payer: Self-pay | Admitting: *Deleted

## 2014-03-12 ENCOUNTER — Emergency Department (HOSPITAL_COMMUNITY)
Admission: EM | Admit: 2014-03-12 | Discharge: 2014-03-13 | Disposition: A | Discharge status: Home / Self Care | Payer: Self-pay | Attending: Emergency Medicine | Admitting: Emergency Medicine

## 2014-03-12 DIAGNOSIS — R109 Unspecified abdominal pain: Secondary | ICD-10-CM

## 2014-03-12 DIAGNOSIS — A5901 Trichomonal vulvovaginitis: Secondary | ICD-10-CM | POA: Insufficient documentation

## 2014-03-12 DIAGNOSIS — N390 Urinary tract infection, site not specified: Secondary | ICD-10-CM | POA: Insufficient documentation

## 2014-03-12 DIAGNOSIS — R1031 Right lower quadrant pain: Secondary | ICD-10-CM

## 2014-03-12 DIAGNOSIS — Z880 Allergy status to penicillin: Secondary | ICD-10-CM | POA: Insufficient documentation

## 2014-03-12 DIAGNOSIS — Z79899 Other long term (current) drug therapy: Secondary | ICD-10-CM | POA: Insufficient documentation

## 2014-03-12 DIAGNOSIS — Z720 Tobacco use: Secondary | ICD-10-CM | POA: Insufficient documentation

## 2014-03-12 DIAGNOSIS — A599 Trichomoniasis, unspecified: Secondary | ICD-10-CM

## 2014-03-12 DIAGNOSIS — N12 Tubulo-interstitial nephritis, not specified as acute or chronic: Secondary | ICD-10-CM | POA: Insufficient documentation

## 2014-03-12 DIAGNOSIS — Z3202 Encounter for pregnancy test, result negative: Secondary | ICD-10-CM | POA: Insufficient documentation

## 2014-03-12 LAB — CBC WITH DIFFERENTIAL/PLATELET
Basophils Absolute: 0.1 10*3/uL (ref 0.0–0.1)
Basophils Relative: 1 % (ref 0–1)
EOS ABS: 0.2 10*3/uL (ref 0.0–0.7)
Eosinophils Relative: 3 % (ref 0–5)
HEMATOCRIT: 46.4 % — AB (ref 36.0–46.0)
HEMOGLOBIN: 16.1 g/dL — AB (ref 12.0–15.0)
LYMPHS ABS: 1.7 10*3/uL (ref 0.7–4.0)
Lymphocytes Relative: 24 % (ref 12–46)
MCH: 30.6 pg (ref 26.0–34.0)
MCHC: 34.7 g/dL (ref 30.0–36.0)
MCV: 88 fL (ref 78.0–100.0)
MONO ABS: 0.6 10*3/uL (ref 0.1–1.0)
MONOS PCT: 8 % (ref 3–12)
NEUTROS PCT: 64 % (ref 43–77)
Neutro Abs: 4.7 10*3/uL (ref 1.7–7.7)
Platelets: 264 10*3/uL (ref 150–400)
RBC: 5.27 MIL/uL — AB (ref 3.87–5.11)
RDW: 12.8 % (ref 11.5–15.5)
WBC: 7.2 10*3/uL (ref 4.0–10.5)

## 2014-03-12 LAB — COMPREHENSIVE METABOLIC PANEL
ALK PHOS: 71 U/L (ref 39–117)
ALT: 14 U/L (ref 0–35)
ANION GAP: 17 — AB (ref 5–15)
AST: 23 U/L (ref 0–37)
Albumin: 3.6 g/dL (ref 3.5–5.2)
BILIRUBIN TOTAL: 0.4 mg/dL (ref 0.3–1.2)
BUN: 6 mg/dL (ref 6–23)
CO2: 19 mEq/L (ref 19–32)
Calcium: 9.2 mg/dL (ref 8.4–10.5)
Chloride: 103 mEq/L (ref 96–112)
Creatinine, Ser: 0.78 mg/dL (ref 0.50–1.10)
GFR calc non Af Amer: >90 mL/min (ref >90)
GLUCOSE: 99 mg/dL (ref 70–99)
POTASSIUM: 4.6 meq/L (ref 3.7–5.3)
Sodium: 139 mEq/L (ref 137–147)
TOTAL PROTEIN: 6.4 g/dL (ref 6.0–8.3)

## 2014-03-12 LAB — LIPASE, BLOOD: Lipase: 25 U/L (ref 11–59)

## 2014-03-12 LAB — PREGNANCY, URINE: Preg Test, Ur: NEGATIVE

## 2014-03-12 MED ORDER — ONDANSETRON HCL 4 MG/2ML IJ SOLN
4.0000 mg | Freq: Once | INTRAMUSCULAR | Status: AC
  Administered 2014-03-12: 4 mg via INTRAVENOUS
  Filled 2014-03-12: qty 2

## 2014-03-12 MED ORDER — FENTANYL CITRATE 0.05 MG/ML IJ SOLN
50.0000 ug | Freq: Once | INTRAMUSCULAR | Status: AC
  Administered 2014-03-12: 50 ug via INTRAVENOUS
  Filled 2014-03-12: qty 2

## 2014-03-12 MED ORDER — SODIUM CHLORIDE 0.9 % IV BOLUS (SEPSIS)
1000.0000 mL | Freq: Once | INTRAVENOUS | Status: AC
  Administered 2014-03-12: 1000 mL via INTRAVENOUS

## 2014-03-12 MED ORDER — KETOROLAC TROMETHAMINE 30 MG/ML IJ SOLN
30.0000 mg | Freq: Once | INTRAMUSCULAR | Status: AC
  Administered 2014-03-12: 30 mg via INTRAVENOUS
  Filled 2014-03-12: qty 1

## 2014-03-12 NOTE — ED Notes (Signed)
The pt is c/o rt lower aBD PAIN   Since 1530.   nauseatedno diarrhea  lmp now

## 2014-03-12 NOTE — ED Provider Notes (Signed)
TIME SEEN: 11:37 PM  CHIEF COMPLAINT: Right abdominal pain, heavy vaginal bleeding  HPI: Pt is a 48 y.o. G4 P4 with history of uterine fibroids, ovarian cysts, prior cesarean section who presents emergency department with complaints of right lower quadrant abdominal pain that started this evening. She states that her period started yesterday and was lighter than normal but now she is having heavy menstrual cycle and has used 7 pads today. She's not passing clots. She has had nausea. No fevers or chills. No vomiting or diarrhea. No dysuria or hematuria.  Denies vaginal discharge. She is sexually active with her husband.  ROS: See HPI Constitutional: no fever  Eyes: no drainage  ENT: no runny nose   Cardiovascular:  no chest pain  Resp: no SOB  GI: no vomiting GU: no dysuria Integumentary: no rash  Allergy: no hives  Musculoskeletal: no leg swelling  Neurological: no slurred speech ROS otherwise negative  PAST MEDICAL HISTORY/PAST SURGICAL HISTORY:  History reviewed. No pertinent past medical history.  MEDICATIONS:  Prior to Admission medications   Medication Sig Start Date End Date Taking? Authorizing Provider  cyclobenzaprine (FLEXERIL) 10 MG tablet Take 10 mg by mouth 2 (two) times daily as needed for muscle spasms. Muscle spasms    Historical Provider, MD  Fexofenadine HCl (ALLEGRA PO) Take 1 tablet by mouth daily. For seasonal allergies    Historical Provider, MD  HYDROcodone-acetaminophen (NORCO/VICODIN) 5-325 MG per tablet Take 1 tablet by mouth every 6 (six) hours as needed for pain. 05/20/12   Varney Biles, MD  ibuprofen (ADVIL,MOTRIN) 600 MG tablet Take 1 tablet (600 mg total) by mouth every 6 (six) hours as needed for pain. 05/20/12   Varney Biles, MD    ALLERGIES:  Allergies  Allergen Reactions  . Penicillins Anaphylaxis  . Lactose Intolerance (Gi) Hives, Itching and Swelling  . Chocolate Itching, Swelling and Rash  . Eggs Or Egg-Derived Products Itching, Swelling and  Rash    SOCIAL HISTORY:  History  Substance Use Topics  . Smoking status: Current Every Day Smoker -- 2 years  . Smokeless tobacco: Not on file  . Alcohol Use: Yes     Comment: occasionally    FAMILY HISTORY: No family history on file.  EXAM: BP 125/78 mmHg  Pulse 106  Temp(Src) 97.8 F (36.6 C)  Resp 18  SpO2 97%  LMP 03/12/2014 CONSTITUTIONAL: Alert and oriented and responds appropriately to questions. Well-appearing; well-nourished HEAD: Normocephalic EYES: Conjunctivae clear, PERRL ENT: normal nose; no rhinorrhea; moist mucous membranes; pharynx without lesions noted NECK: Supple, no meningismus, no LAD  CARD: Regular and tachycardic; S1 and S2 appreciated; no murmurs, no clicks, no rubs, no gallops RESP: Normal chest excursion without splinting or tachypnea; breath sounds clear and equal bilaterally; no wheezes, no rhonchi, no rales,  ABD/GI: Normal bowel sounds; non-distended; soft, tender to palpation in the right pelvic region with voluntary guarding, no rebound, no tenderness at McBurney's point GU:  Normal external genitalia, patient has a moderate amount of dark red blood coming from the cervical os, no clots, patient has right adnexal tenderness without fullness, no cervical motion tenderness, no left adnexal tenderness or fullness BACK:  The back appears normal and is non-tender to palpation, there is no CVA tenderness EXT: Normal ROM in all joints; non-tender to palpation; no edema; normal capillary refill; no cyanosis    SKIN: Normal color for age and race; warm NEURO: Moves all extremities equally PSYCH: The patient's mood and manner are appropriate. Grooming and personal  hygiene are appropriate.  MEDICAL DECISION MAKING: Pt here with right-sided pelvic pain. Differential diagnosis includes ectopic pregnancy, UTI, kidney stone, ovarian torsion, ovarian cysts, TOA, PID. Labs ordered in triage are unremarkable. No leukocytosis. We'll give IV fluids, pain  medication, nausea medicine. We'll perform pelvic exam with cultures. We'll obtain transvaginal ultrasound with Doppler. Less likely appendicitis given sudden onset and no fever, leukocytosis.  ED PROGRESS: Patient does appear to have a nitrite-positive UTI. Culture pending. We'll give ceftriaxone. She does have an allergy to penicillin but reports it was hives and feeling like she had difficulty breathing but she was not given epinephrine, intubated and did not have to be hospitalized. She is not sure she's had cephalosporins before. I feel she is safe to get ceftriaxone in the emergency department. She is still in a significant amount pain after Toradol, fentanyl and her blood pressure has improved to 134/78. Will give IV Dilaudid.   1:06 AM  Pt early ultrasound. She has required multiple rounds of IV narcotics to keep her pain controlled. She is positive for trichomonas. Will treat with Flagyl. Gonorrhea and chlamydia testing pending.  Discussed with Dr. Venora Maples who will follow-up on patient's ultrasound. If ultrasound negative, will her committed ordering a CT abdomen and pelvis for evaluation for kidney stone given patient sudden onset pain.  [place], DO 03/14/14 [phone]

## 2014-03-13 ENCOUNTER — Encounter (HOSPITAL_COMMUNITY): Payer: Self-pay | Admitting: Radiology

## 2014-03-13 ENCOUNTER — Emergency Department (HOSPITAL_COMMUNITY): Payer: Medicaid Other

## 2014-03-13 ENCOUNTER — Emergency Department (HOSPITAL_COMMUNITY): Payer: Self-pay

## 2014-03-13 LAB — WET PREP, GENITAL
Clue Cells Wet Prep HPF POC: NONE SEEN
Yeast Wet Prep HPF POC: NONE SEEN

## 2014-03-13 LAB — URINE MICROSCOPIC-ADD ON

## 2014-03-13 LAB — URINALYSIS, ROUTINE W REFLEX MICROSCOPIC
GLUCOSE, UA: NEGATIVE mg/dL
KETONES UR: 15 mg/dL — AB
Nitrite: POSITIVE — AB
PROTEIN: 100 mg/dL — AB
Specific Gravity, Urine: 1.023 (ref 1.005–1.030)
Urobilinogen, UA: 1 mg/dL (ref 0.0–1.0)
pH: 8 (ref 5.0–8.0)

## 2014-03-13 IMAGING — CT CT ABD-PELV W/O CM
2 of 4 series · 16 of 46 positions shown, 18 images · non-contrast
Comparison: 03/13/2014 and prior ultrasounds.  10/06/2007 CT

CLINICAL DATA: 48-year-old female with acute right abdominal and
pelvic pain. Initial encounter.

EXAM:
CT ABDOMEN AND PELVIS WITHOUT CONTRAST
TECHNIQUE: Multidetector CT imaging of the abdomen and pelvis was performed
following the standard protocol without IV contrast.

[Series 2: abd/ pelvis 5.0 i30f 1 · axial · 0.85mm/px · z∈[+202,+647]mm · 13 of 99 slices shown, 15 images]
[im 5/99  soft-tissue]
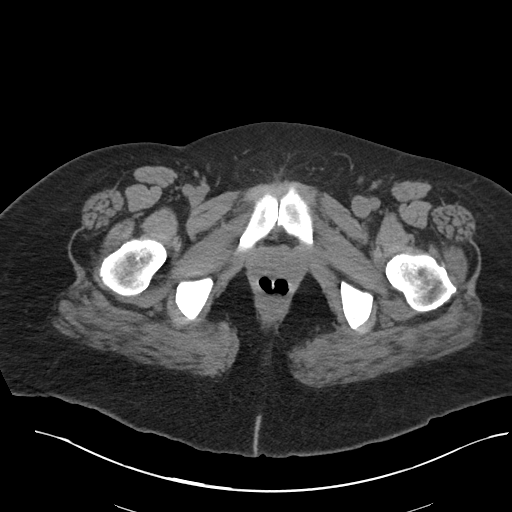
[im 5/99  bone]
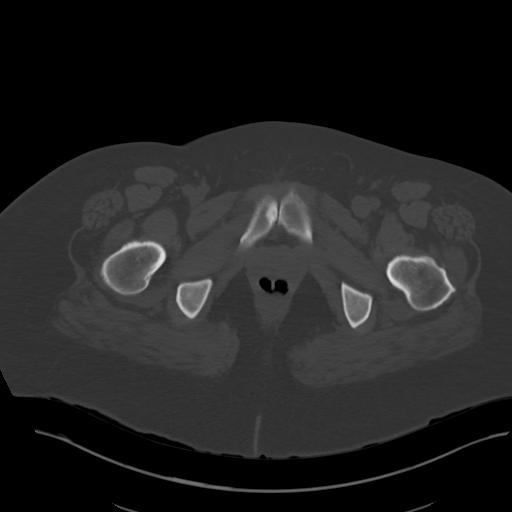
[im 13/99  soft-tissue]
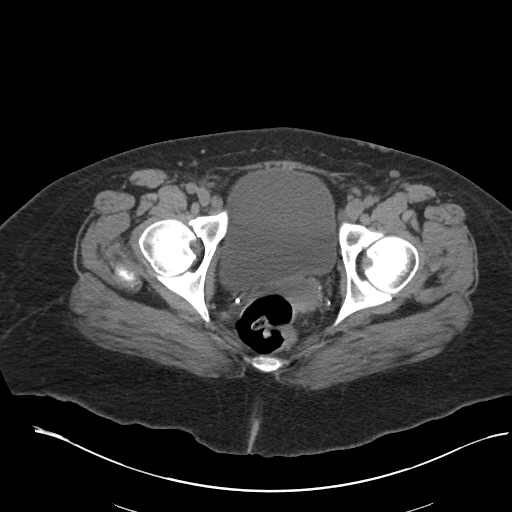
[im 21/99  soft-tissue]
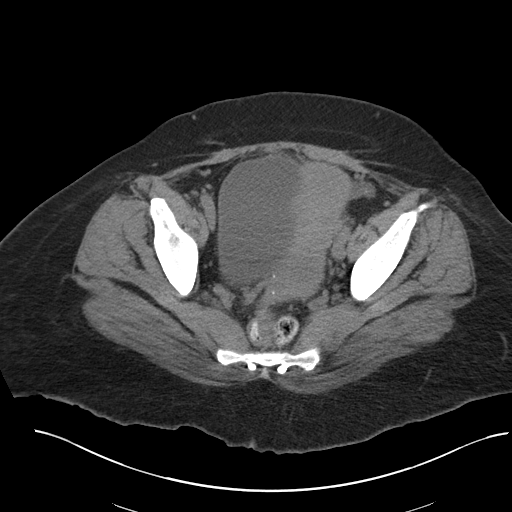
[im 29/99  soft-tissue]
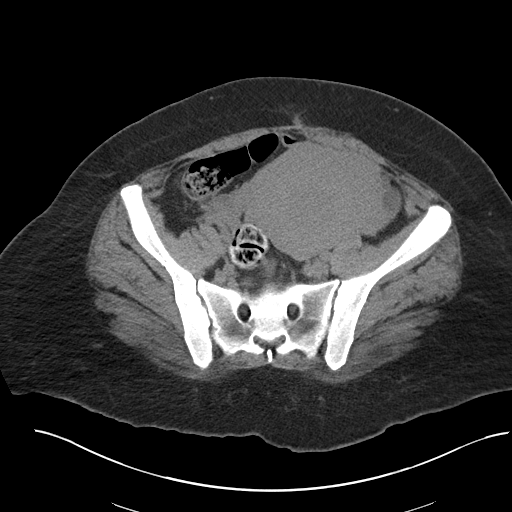
[im 33/99  soft-tissue]
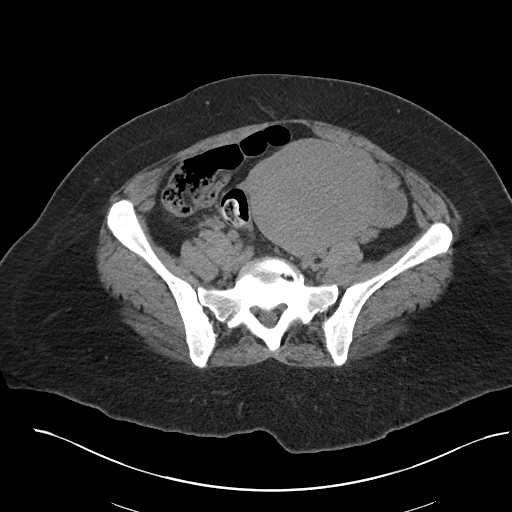
[im 41/99  soft-tissue]
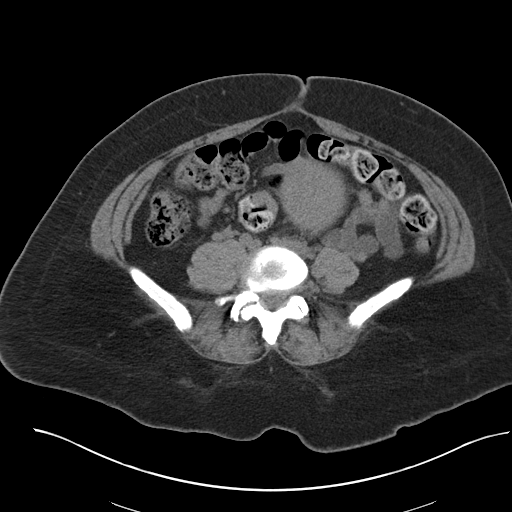
[im 50/99  soft-tissue]
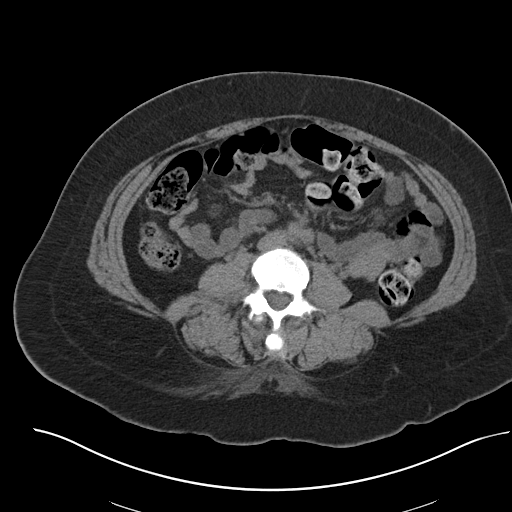
[im 58/99  soft-tissue]
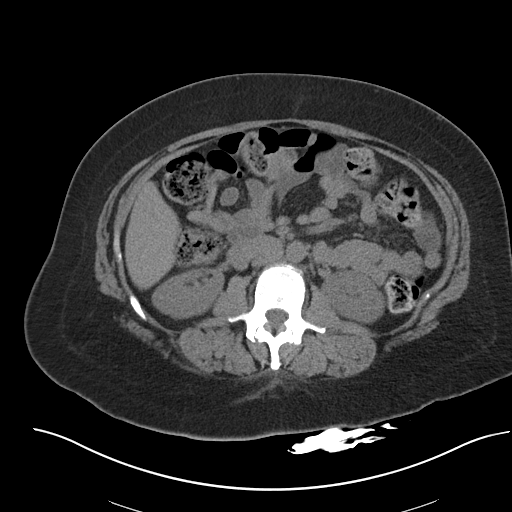
[im 66/99  soft-tissue]
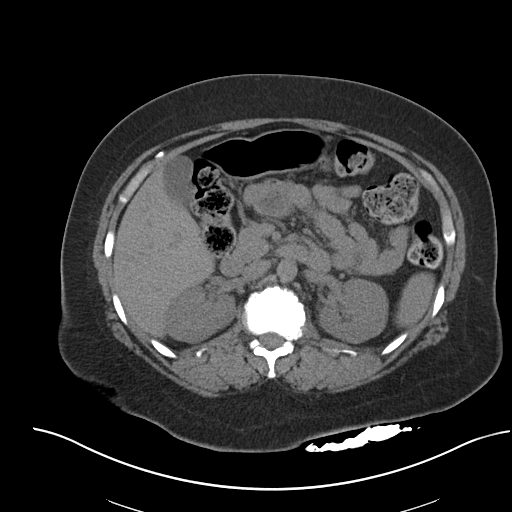
[im 66/99  bone]
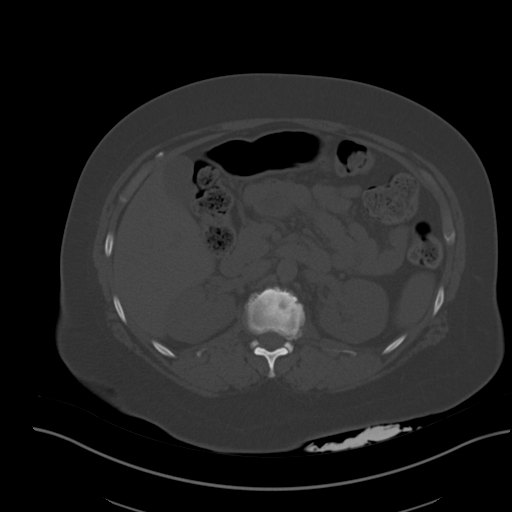
[im 70/99  soft-tissue]
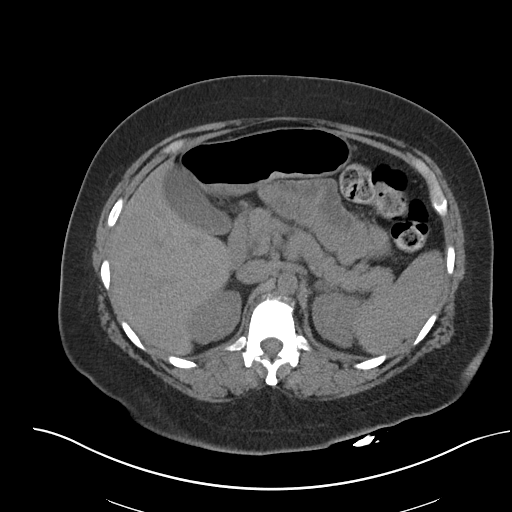
[im 78/99  soft-tissue]
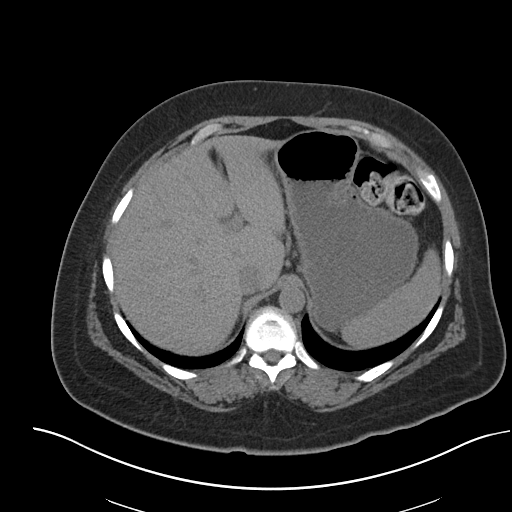
[im 86/99  soft-tissue]
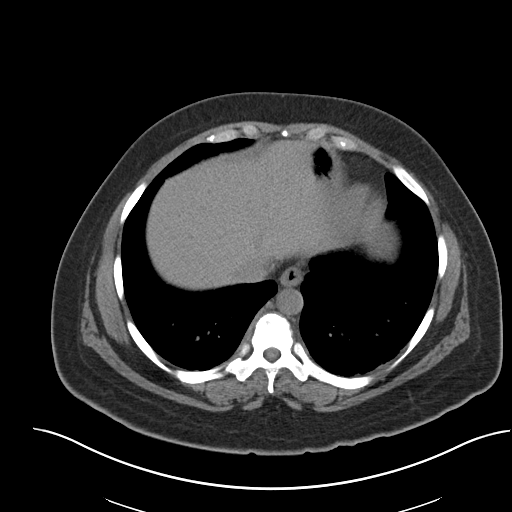
[im 94/99  soft-tissue]
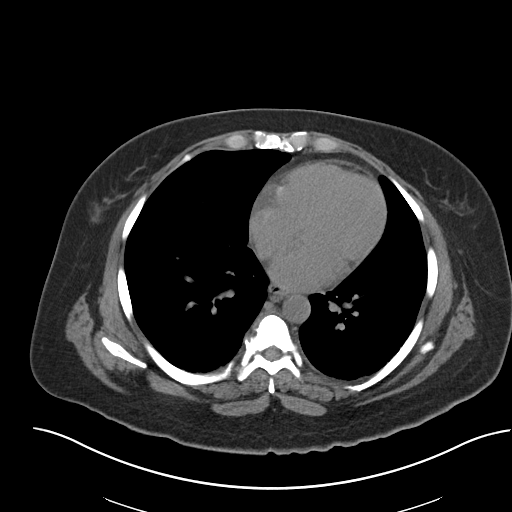

[Series 5: coronals · coronal · 0.77mm/px · 3 of 130 slices shown]
[im 44/130  soft-tissue]
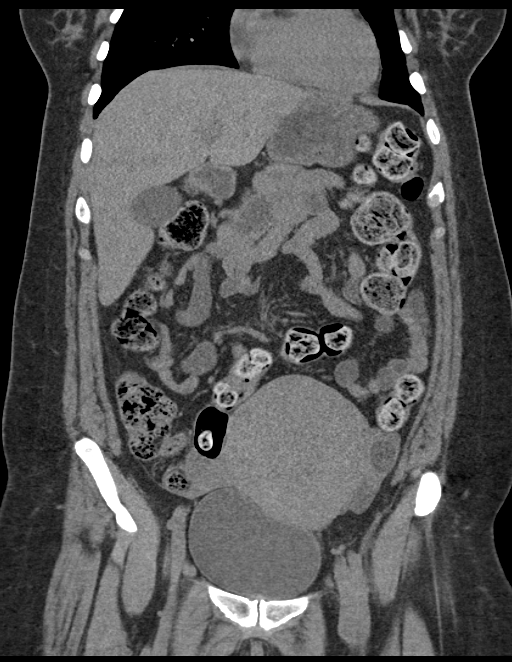
[im 58/130  soft-tissue]
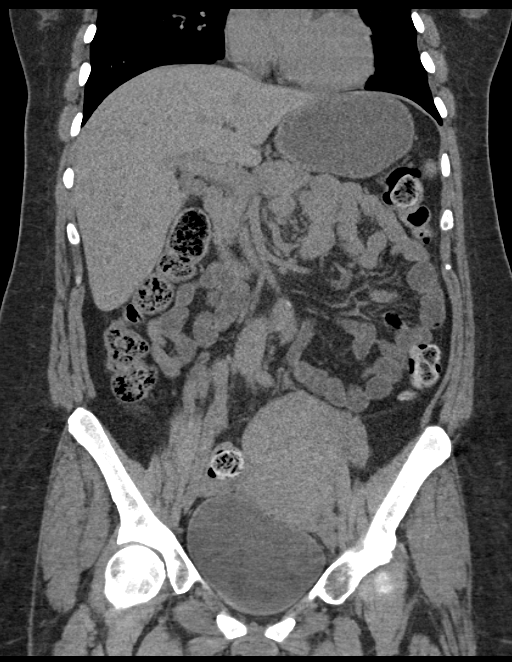
[im 72/130  soft-tissue]
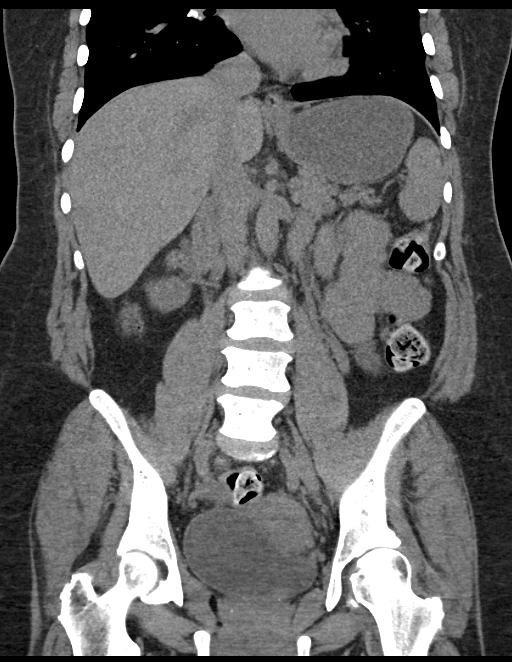

[16 of 46 positions shown; findings below may reference images not displayed]

FINDINGS: The liver, gallbladder, spleen, pancreas, adrenal glands and kidneys
are unremarkable.

Please note that parenchymal abnormalities may be missed without
intravenous contrast.

There is no evidence of free fluid, enlarged lymph nodes, biliary
dilation or abdominal aortic aneurysm.

There is no evidence of bowel obstruction, pneumoperitoneum or
collection/abscess.

An enlarged fibroid uterus is again noted. The adnexal regions are
unremarkable.

No acute or suspicious bony abnormalities are identified.

Degenerative changes in the spine are identified, severe at T12-L1.
IMPRESSION: No evidence of acute abnormality.

Enlarged fibroid uterus again noted.

## 2014-03-13 IMAGING — US US TRANSVAGINAL NON-OB
1 series · 13 of 25 positions shown · non-contrast
Comparison: None.

CLINICAL DATA: Acute onset of right lower quadrant abdominal pain.
Initial encounter.

EXAM:
TRANSABDOMINAL AND TRANSVAGINAL ULTRASOUND OF PELVIS
DOPPLER ULTRASOUND OF OVARIES
TECHNIQUE: Both transabdominal and transvaginal ultrasound examinations of the
pelvis were performed. Transabdominal technique was performed for
global imaging of the pelvis including uterus, ovaries, adnexal
regions, and pelvic cul-de-sac.
It was necessary to proceed with endovaginal exam following the
transabdominal exam to visualize the uterus and ovaries in greater
detail. Color and duplex Doppler ultrasound was utilized to evaluate
blood flow to the ovaries.

[Series 1: us transvaginal non-ob · 0.24mm/px · 13 of 51 slices shown]
[im 1/51]
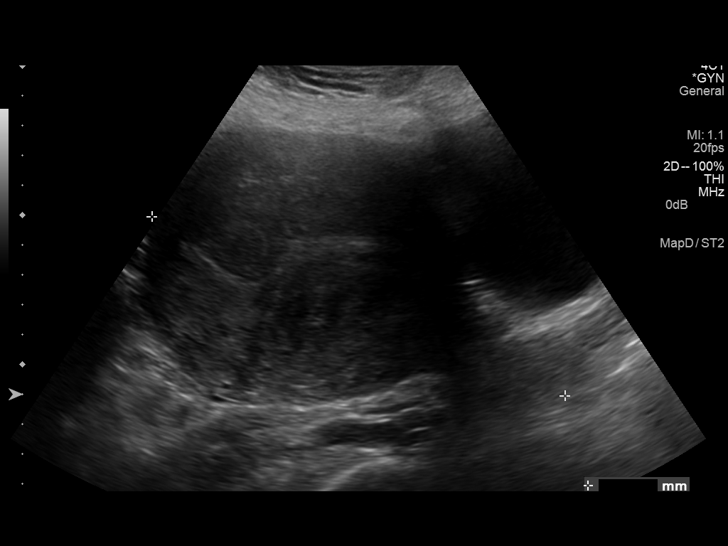
[im 5/51]
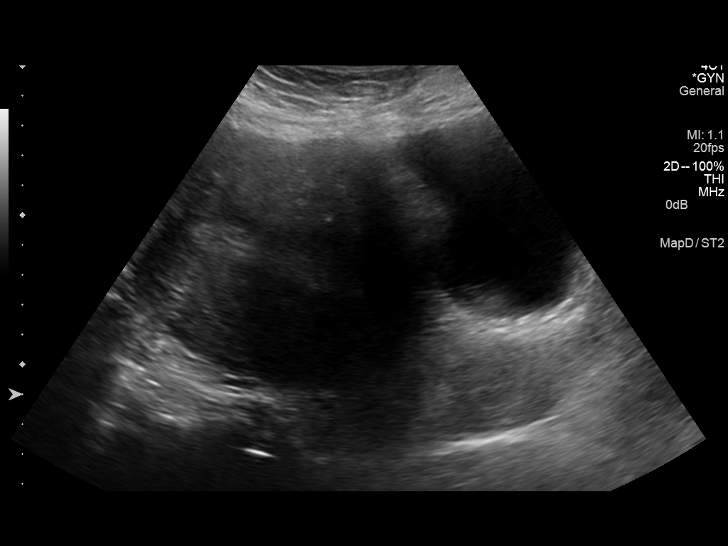
[im 9/51]
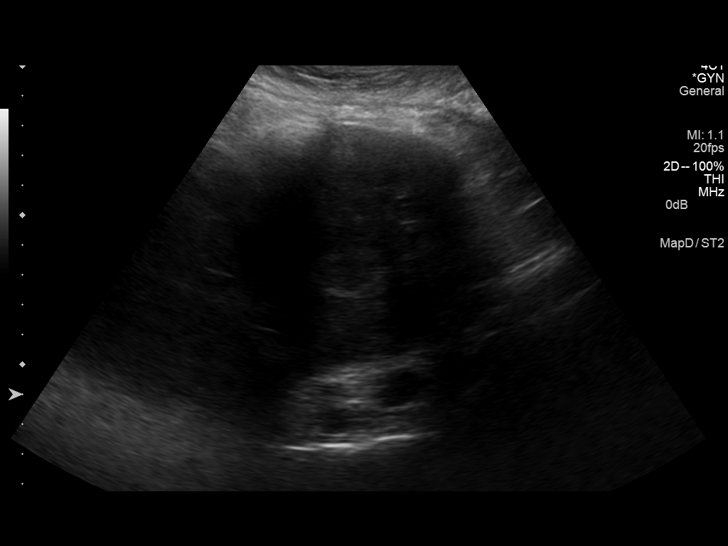
[im 13/51]
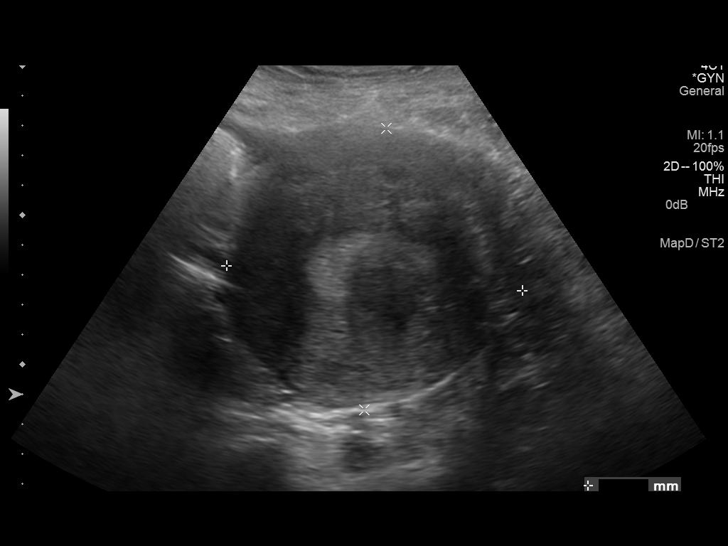
[im 17/51]
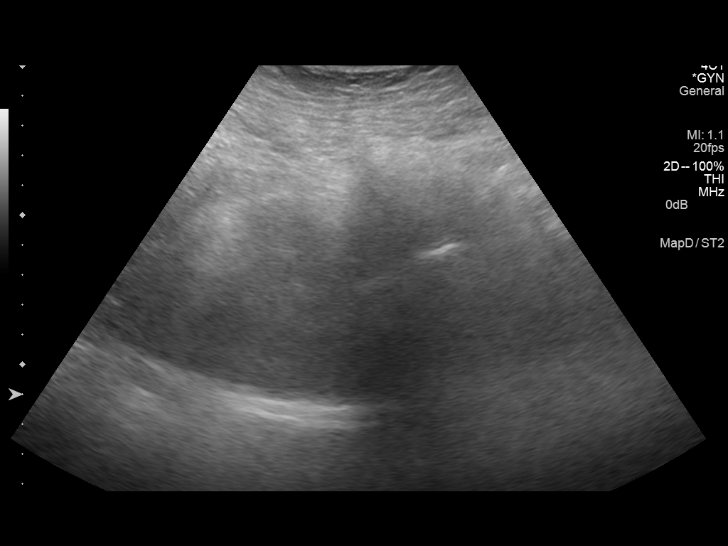
[im 21/51]
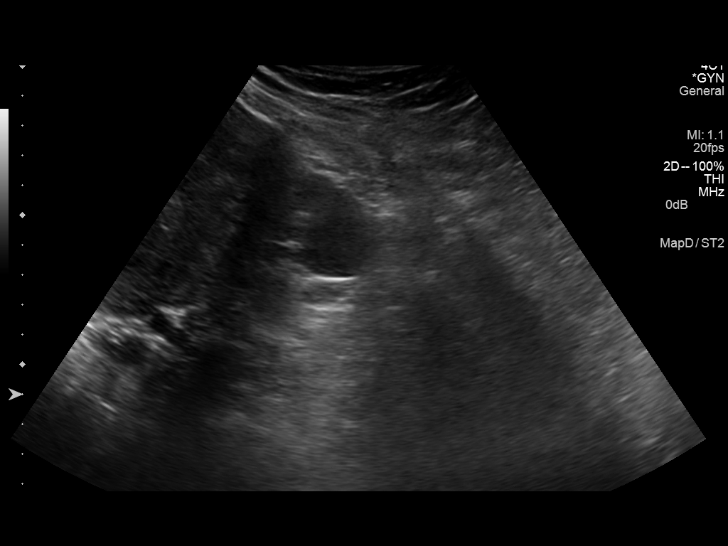
[im 26/51]
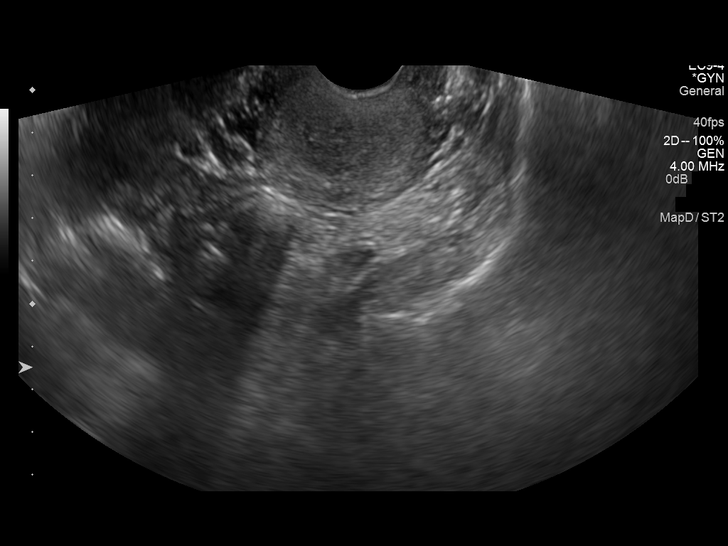
[im 30/51]
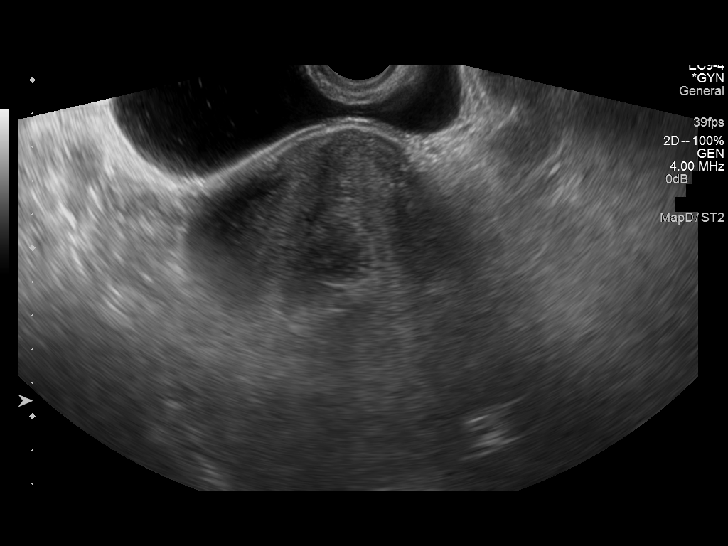
[im 34/51]
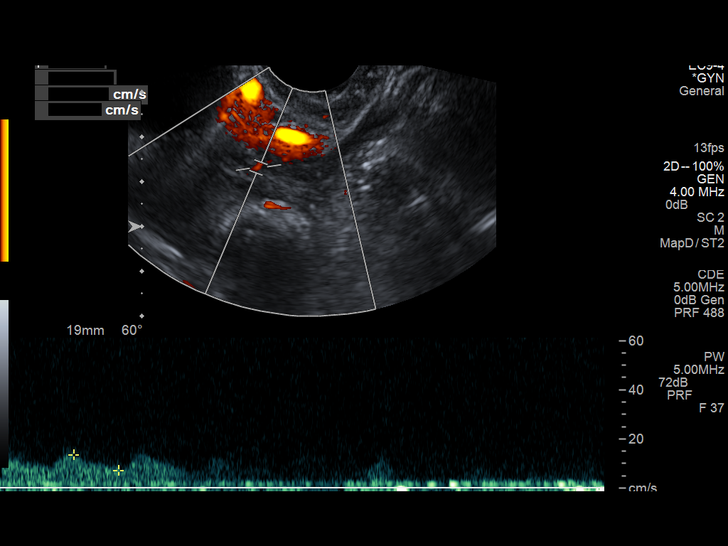
[im 38/51]
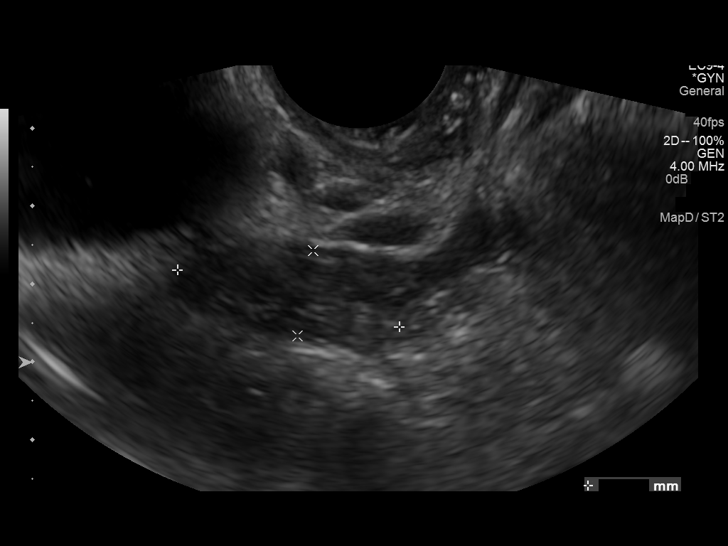
[im 42/51]
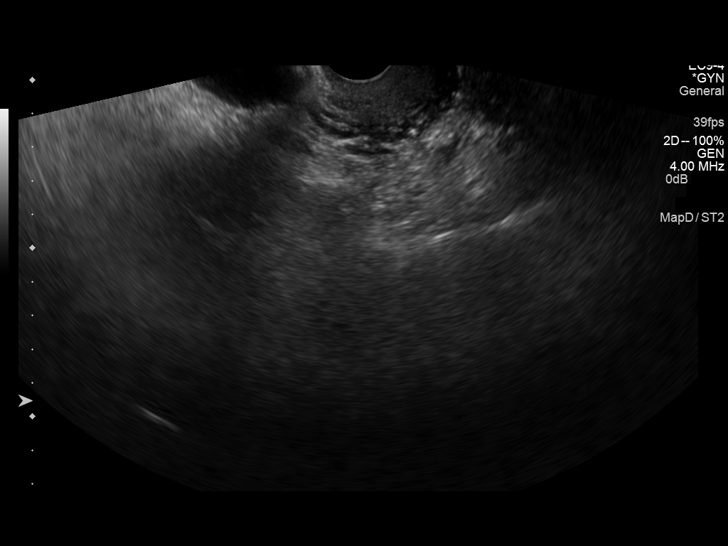
[im 46/51]
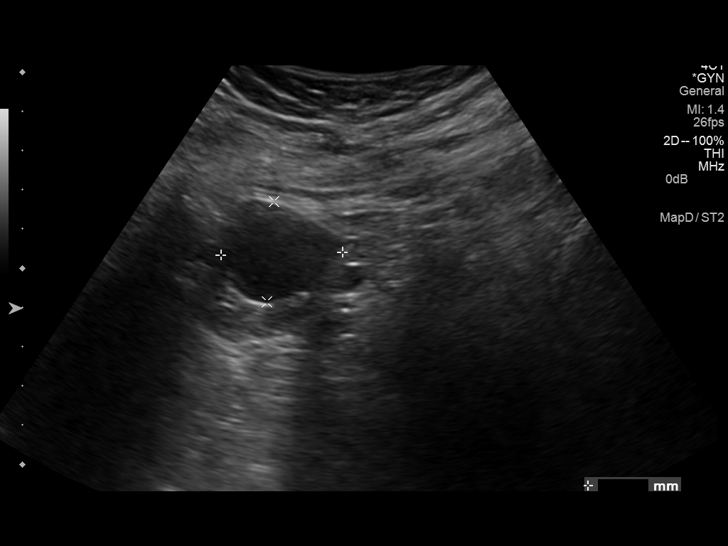
[im 51/51]
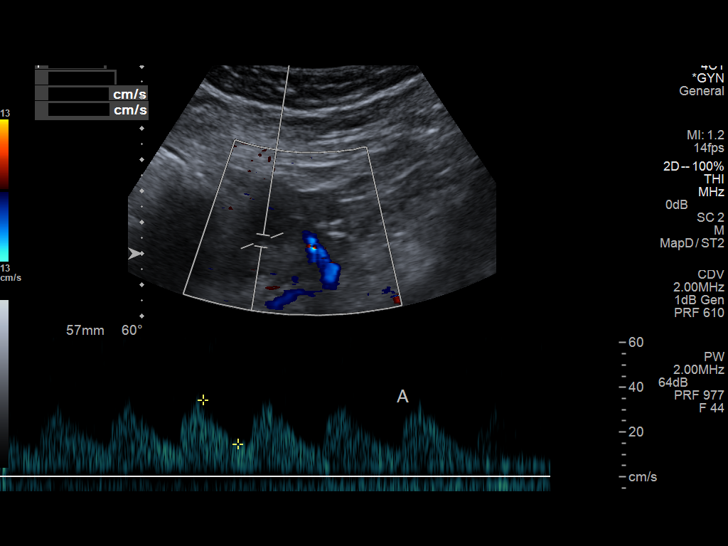

[13 of 25 positions shown; findings below may reference images not displayed]

FINDINGS: Uterus

Measurements: 15.6 x 9.5 x 5.7 cm. Multiple fibroids are seen,
measuring up to 4.8 x 2.8 x 3.1 cm. The largest fibroid is
submucosal in nature, on the left side. Remaining smaller fibroids
appear to be intramural in nature.

Endometrium

Thickness: 1.7 cm.  Partially obscured by the submucosal fibroid.

Right ovary

Measurements: 2.9 x 1.1 x 2.1 cm. Normal appearance/no adnexal mass.

Left ovary

Measurements: 4.2 x 3.6 x 3.0 cm. A 3.1 x 2.6 x 2.1 cm simple cyst
at the left ovary is likely physiologic in nature.

Pulsed Doppler evaluation of both ovaries demonstrates normal
low-resistance arterial and venous waveforms.

Other findings

A small amount of free fluid is seen within the pelvic cul-de-sac.
IMPRESSION: 1. Enlarged fibroid uterus noted, including a 4.8 cm submucosal
fibroid.
2. Otherwise unremarkable pelvic ultrasound; no evidence for ovarian
torsion.

## 2014-03-13 MED ORDER — HYDROMORPHONE HCL 1 MG/ML IJ SOLN
1.0000 mg | Freq: Once | INTRAMUSCULAR | Status: AC
  Administered 2014-03-13: 1 mg via INTRAVENOUS
  Filled 2014-03-13: qty 1

## 2014-03-13 MED ORDER — CEPHALEXIN 500 MG PO CAPS: 500.0000 mg | ORAL_CAPSULE | Freq: Three times a day (TID) | ORAL | Status: AC

## 2014-03-13 MED ORDER — METRONIDAZOLE 500 MG PO TABS
2000.0000 mg | ORAL_TABLET | Freq: Once | ORAL | Status: AC
  Administered 2014-03-13: 2000 mg via ORAL
  Filled 2014-03-13: qty 4

## 2014-03-13 MED ORDER — OXYCODONE-ACETAMINOPHEN 5-325 MG PO TABS: 1.0000 | ORAL_TABLET | ORAL | Status: AC | PRN

## 2014-03-13 MED ORDER — HYDROMORPHONE HCL 1 MG/ML IJ SOLN
2.0000 mg | Freq: Once | INTRAMUSCULAR | Status: AC
  Administered 2014-03-13: 2 mg via INTRAVENOUS
  Filled 2014-03-13: qty 2

## 2014-03-13 MED ORDER — DEXTROSE 5 % IV SOLN
1.0000 g | Freq: Once | INTRAVENOUS | Status: AC
  Administered 2014-03-13: 1 g via INTRAVENOUS
  Filled 2014-03-13: qty 10

## 2014-03-13 MED ORDER — OXYCODONE-ACETAMINOPHEN 5-325 MG PO TABS
1.0000 | ORAL_TABLET | Freq: Once | ORAL | Status: AC
  Administered 2014-03-13: 1 via ORAL
  Filled 2014-03-13: qty 1

## 2014-03-13 MED ORDER — PROMETHAZINE HCL 25 MG PO TABS: 25.0000 mg | ORAL_TABLET | Freq: Four times a day (QID) | ORAL | Status: AC | PRN

## 2014-03-13 MED ORDER — ONDANSETRON 4 MG PO TBDP
4.0000 mg | ORAL_TABLET | Freq: Once | ORAL | Status: AC
  Administered 2014-03-13: 4 mg via ORAL

## 2014-03-13 MED ORDER — ONDANSETRON 4 MG PO TBDP
ORAL_TABLET | ORAL | Status: AC
  Filled 2014-03-13: qty 1

## 2014-03-13 NOTE — ED Notes (Signed)
Discharge instructions and prescriptions reviewed, voiced understanding.

## 2014-03-13 NOTE — ED Notes (Signed)
Patient presents moaning out loud.  States about 330 this afternoon when she got home from work she was feeling OK.  After taking a nap she woke up with severe pain to the right lower quad the radiates to her back and to the groin.  States her period is very heavy, needing to wear a towel.

## 2014-03-13 NOTE — ED Provider Notes (Signed)
4:06 AM Patient is feeling better at this time.  Suspect right-sided pyelonephritis.  Ultrasound without significant abnormalities.  CT scan demonstrates no evidence of stone.  Home with Keflex.  Urine culture sent.  Rocephin given in ER.  Home with pain control, nausea medicine, antibiotics.  Patient understands to return to the ER for new or worsening symptoms  1. Urinary tract infection without hematuria, site unspecified   2. Right lower quadrant abdominal pain   3. Trichomonas infection   4. Right flank pain   5. Pyelonephritis     Ct Abdomen Pelvis Wo Contrast  03/13/2014   CLINICAL DATA:  48 year old female with acute right abdominal and pelvic pain. Initial encounter.  EXAM: CT ABDOMEN AND PELVIS WITHOUT CONTRAST  TECHNIQUE: Multidetector CT imaging of the abdomen and pelvis was performed following the standard protocol without IV contrast.  COMPARISON:  03/13/2014 and prior ultrasounds.  10/06/2007 CT  FINDINGS: The liver, gallbladder, spleen, pancreas, adrenal glands and kidneys are unremarkable.  Please note that parenchymal abnormalities may be missed without intravenous contrast.  There is no evidence of free fluid, enlarged lymph nodes, biliary dilation or abdominal aortic aneurysm.  There is no evidence of bowel obstruction, pneumoperitoneum or collection/abscess.  An enlarged fibroid uterus is again noted. The adnexal regions are unremarkable.  No acute or suspicious bony abnormalities are identified.  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  IMPRESSION: No evidence of acute abnormality.  Enlarged fibroid uterus again noted.   Electronically Signed   By: Hassan Rowan M.D.   On: 03/13/2014 03:04   US Transvaginal Non-ob  03/13/2014   CLINICAL DATA:  Acute onset of right lower quadrant abdominal pain. Initial encounter.  EXAM: TRANSABDOMINAL AND TRANSVAGINAL ULTRASOUND OF PELVIS  DOPPLER ULTRASOUND OF OVARIES  TECHNIQUE: Both transabdominal and transvaginal  ultrasound examinations of the pelvis were performed. Transabdominal technique was performed for global imaging of the pelvis including uterus, ovaries, adnexal regions, and pelvic cul-de-sac.  It was necessary to proceed with endovaginal exam following the transabdominal exam to visualize the uterus and ovaries in greater detail. Color and duplex Doppler ultrasound was utilized to evaluate blood flow to the ovaries.  COMPARISON:  None.  FINDINGS: Uterus  Measurements: 15.6 x 9.5 x 5.7 cm. Multiple fibroids are seen, measuring up to 4.8 x 2.8 x 3.1 cm. The largest fibroid is submucosal in nature, on the left side. Remaining smaller fibroids appear to be intramural in nature.  Endometrium  Thickness: 1.7 cm.  Partially obscured by the submucosal fibroid.  Right ovary  Measurements: 2.9 x 1.1 x 2.1 cm. Normal appearance/no adnexal mass.  Left ovary  Measurements: 4.2 x 3.6 x 3.0 cm. A 3.1 x 2.6 x 2.1 cm simple cyst at the left ovary is likely physiologic in nature.  Pulsed Doppler evaluation of both ovaries demonstrates normal low-resistance arterial and venous waveforms.  Other findings  A small amount of free fluid is seen within the pelvic cul-de-sac.  IMPRESSION: 1. Enlarged fibroid uterus noted, including a 4.8 cm submucosal fibroid. 2. Otherwise unremarkable pelvic ultrasound; no evidence for ovarian torsion.   Electronically Signed   By: Garald Balding M.D.   On: 03/13/2014 02:02   US Pelvis Complete  03/13/2014   CLINICAL DATA:  Acute onset of right lower quadrant abdominal pain. Initial encounter.  EXAM: TRANSABDOMINAL AND TRANSVAGINAL ULTRASOUND OF PELVIS  DOPPLER ULTRASOUND OF OVARIES  TECHNIQUE: Both transabdominal and transvaginal ultrasound examinations of the pelvis were performed. Transabdominal technique was performed  for global imaging of the pelvis including uterus, ovaries, adnexal regions, and pelvic cul-de-sac.  It was necessary to proceed with endovaginal exam following the transabdominal  exam to visualize the uterus and ovaries in greater detail. Color and duplex Doppler ultrasound was utilized to evaluate blood flow to the ovaries.  COMPARISON:  None.  FINDINGS: Uterus  Measurements: 15.6 x 9.5 x 5.7 cm. Multiple fibroids are seen, measuring up to 4.8 x 2.8 x 3.1 cm. The largest fibroid is submucosal in nature, on the left side. Remaining smaller fibroids appear to be intramural in nature.  Endometrium  Thickness: 1.7 cm.  Partially obscured by the submucosal fibroid.  Right ovary  Measurements: 2.9 x 1.1 x 2.1 cm. Normal appearance/no adnexal mass.  Left ovary  Measurements: 4.2 x 3.6 x 3.0 cm. A 3.1 x 2.6 x 2.1 cm simple cyst at the left ovary is likely physiologic in nature.  Pulsed Doppler evaluation of both ovaries demonstrates normal low-resistance arterial and venous waveforms.  Other findings  A small amount of free fluid is seen within the pelvic cul-de-sac.  IMPRESSION: 1. Enlarged fibroid uterus noted, including a 4.8 cm submucosal fibroid. 2. Otherwise unremarkable pelvic ultrasound; no evidence for ovarian torsion.   Electronically Signed   By: Garald Balding M.D.   On: 03/13/2014 02:02   [place] Art/ven Flow Abd Pelv Doppler  03/13/2014   CLINICAL DATA:  Acute onset of right lower quadrant abdominal pain. Initial encounter.  EXAM: TRANSABDOMINAL AND TRANSVAGINAL ULTRASOUND OF PELVIS  DOPPLER ULTRASOUND OF OVARIES  TECHNIQUE: Both transabdominal and transvaginal ultrasound examinations of the pelvis were performed. Transabdominal technique was performed for global imaging of the pelvis including uterus, ovaries, adnexal regions, and pelvic cul-de-sac.  It was necessary to proceed with endovaginal exam following the transabdominal exam to visualize the uterus and ovaries in greater detail. Color and duplex Doppler ultrasound was utilized to evaluate blood flow to the ovaries.  COMPARISON:  None.  FINDINGS: Uterus  Measurements: 15.6 x 9.5 x 5.7 cm. Multiple fibroids are seen, measuring up  to 4.8 x 2.8 x 3.1 cm. The largest fibroid is submucosal in nature, on the left side. Remaining smaller fibroids appear to be intramural in nature.  Endometrium  Thickness: 1.7 cm.  Partially obscured by the submucosal fibroid.  Right ovary  Measurements: 2.9 x 1.1 x 2.1 cm. Normal appearance/no adnexal mass.  Left ovary  Measurements: 4.2 x 3.6 x 3.0 cm. A 3.1 x 2.6 x 2.1 cm simple cyst at the left ovary is likely physiologic in nature.  Pulsed Doppler evaluation of both ovaries demonstrates normal low-resistance arterial and venous waveforms.  Other findings  A small amount of free fluid is seen within the pelvic cul-de-sac.  IMPRESSION: 1. Enlarged fibroid uterus noted, including a 4.8 cm submucosal fibroid. 2. Otherwise unremarkable pelvic ultrasound; no evidence for ovarian torsion.   Electronically Signed   By: Garald Balding M.D.   On: 03/13/2014 02:02   I personally reviewed the imaging tests through PACS system I reviewed available ER/hospitalization records through the EMR  Results for orders placed or performed during the hospital encounter of 03/12/14  Wet prep, genital  Result Value Ref Range   Yeast Wet Prep HPF POC NONE SEEN NONE SEEN   Trich, Wet Prep FEW (A) NONE SEEN   Clue Cells Wet Prep HPF POC NONE SEEN NONE SEEN   WBC, Wet Prep HPF POC FEW (A) NONE SEEN  CBC with Differential  Result Value Ref Range  WBC 7.2 4.0 - 10.5 K/uL   RBC 5.27 (H) 3.87 - 5.11 MIL/uL   Hemoglobin 16.1 (H) 12.0 - 15.0 g/dL   HCT 46.4 (H) 36.0 - 46.0 %   MCV 88.0 78.0 - 100.0 fL   MCH 30.6 26.0 - 34.0 pg   MCHC 34.7 30.0 - 36.0 g/dL   RDW 12.8 11.5 - 15.5 %   Platelets 264 150 - 400 K/uL   Neutrophils Relative % 64 43 - 77 %   Neutro Abs 4.7 1.7 - 7.7 K/uL   Lymphocytes Relative 24 12 - 46 %   Lymphs Abs 1.7 0.7 - 4.0 K/uL   Monocytes Relative 8 3 - 12 %   Monocytes Absolute 0.6 0.1 - 1.0 K/uL   Eosinophils Relative 3 0 - 5 %   Eosinophils Absolute 0.2 0.0 - 0.7 K/uL   Basophils Relative 1 0  - 1 %   Basophils Absolute 0.1 0.0 - 0.1 K/uL  Comprehensive metabolic panel  Result Value Ref Range   Sodium 139 137 - 147 mEq/L   Potassium 4.6 3.7 - 5.3 mEq/L   Chloride 103 96 - 112 mEq/L   CO2 19 19 - 32 mEq/L   Glucose, Bld 99 70 - 99 mg/dL   BUN 6 6 - 23 mg/dL   Creatinine, Ser 0.78 0.50 - 1.10 mg/dL   Calcium 9.2 8.4 - 10.5 mg/dL   Total Protein 6.4 6.0 - 8.3 g/dL   Albumin 3.6 3.5 - 5.2 g/dL   AST 23 0 - 37 U/L   ALT 14 0 - 35 U/L   Alkaline Phosphatase 71 39 - 117 U/L   Total Bilirubin 0.4 0.3 - 1.2 mg/dL   GFR calc non Af Amer >90 >90 mL/min   GFR calc Af Amer >90 >90 mL/min   Anion gap 17 (H) 5 - 15  Lipase, blood  Result Value Ref Range   Lipase 25 11 - 59 U/L  Pregnancy, urine  Result Value Ref Range   Preg Test, Ur NEGATIVE NEGATIVE  Urinalysis, Routine w reflex microscopic  Result Value Ref Range   Color, Urine RED (A) YELLOW   APPearance CLOUDY (A) CLEAR   Specific Gravity, Urine 1.023 1.005 - 1.030   pH 8.0 5.0 - 8.0   Glucose, UA NEGATIVE NEGATIVE mg/dL   Hgb urine dipstick LARGE (A) NEGATIVE   Bilirubin Urine MODERATE (A) NEGATIVE   Ketones, ur 15 (A) NEGATIVE mg/dL   Protein, ur 100 (A) NEGATIVE mg/dL   Urobilinogen, UA 1.0 0.0 - 1.0 mg/dL   Nitrite POSITIVE (A) NEGATIVE   Leukocytes, UA MODERATE (A) NEGATIVE  Urine microscopic-add on  Result Value Ref Range   Squamous Epithelial / LPF RARE RARE   WBC, UA 7-10 <3 WBC/hpf   RBC / HPF TOO NUMEROUS TO COUNT <3 RBC/hpf     Hoy Morn, MD 03/13/14 [id number]

## 2014-03-13 NOTE — Discharge Instructions (Signed)
Trichomoniasis Trichomoniasis is an infection caused by an organism called Trichomonas. The infection can affect both women and men. In women, the outer female genitalia and the vagina are affected. In men, the penis is mainly affected, but the prostate and other reproductive organs can also be involved. Trichomoniasis is a sexually transmitted infection (STI) and is most often passed to another person through sexual contact.  RISK FACTORS  Having unprotected sexual intercourse.  Having sexual intercourse with an infected partner. SIGNS AND SYMPTOMS  Symptoms of trichomoniasis in women include:  Abnormal gray-green frothy vaginal discharge.  Itching and irritation of the vagina.  Itching and irritation of the area outside the vagina. Symptoms of trichomoniasis in men include:   Penile discharge with or without pain.  Pain during urination. This results from inflammation of the urethra. DIAGNOSIS  Trichomoniasis may be found during a Pap test or physical exam. Your health care provider may use one of the following methods to help diagnose this infection:  Examining vaginal discharge under a microscope. For men, urethral discharge would be examined.  Testing the pH of the vagina with a test tape.  Using a vaginal swab test that checks for the Trichomonas organism. A test is available that provides results within a few minutes.  Doing a culture test for the organism. This is not usually needed. TREATMENT   You may be given medicine to fight the infection. Women should inform their health care provider if they could be or are pregnant. Some medicines used to treat the infection should not be taken during pregnancy.  Your health care provider may recommend over-the-counter medicines or creams to decrease itching or irritation.  Your sexual partner will need to be treated if infected. HOME CARE INSTRUCTIONS   Take medicines only as directed by your health care provider.  Take  over-the-counter medicine for itching or irritation as directed by your health care provider.  Do not have sexual intercourse while you have the infection.  Women should not douche or wear tampons while they have the infection.  Discuss your infection with your partner. Your partner may have gotten the infection from you, or you may have gotten it from your partner.  Have your sex partner get examined and treated if necessary.  Practice safe, informed, and protected sex.  See your health care provider for other STI testing. SEEK MEDICAL CARE IF:   You still have symptoms after you finish your medicine.  You develop abdominal pain.  You have pain when you urinate.  You have bleeding after sexual intercourse.  You develop a rash.  Your medicine makes you sick or makes you throw up (vomit). MAKE SURE YOU:  Understand these instructions.  Will watch your condition.  Will get help right away if you are not doing well or get worse. Document Released: 09/21/2000 Document Revised: 08/12/2013 Document Reviewed: 01/07/2013 [facility] Patient Information 2015 [place], [place]. This information is not intended to replace advice given to you by your health care provider. Make sure you discuss any questions you have with your health care provider. Pyelonephritis, Adult Pyelonephritis is a kidney infection. In general, there are 2 main types of pyelonephritis:  Infections that come on quickly without any warning (acute pyelonephritis).  Infections that persist for a long period of time (chronic pyelonephritis). CAUSES  Two main causes of pyelonephritis are:  Bacteria traveling from the bladder to the kidney. This is a problem especially in pregnant women. The urine in the bladder can become filled with bacteria  from multiple causes, including:  Inflammation of the prostate gland (prostatitis).  Sexual intercourse in females.  Bladder infection (cystitis).  Bacteria traveling from  the bloodstream to the tissue part of the kidney. Problems that may increase your risk of getting a kidney infection include:  Diabetes.  Kidney stones or bladder stones.  Cancer.  Catheters placed in the bladder.  Other abnormalities of the kidney or ureter. SYMPTOMS   Abdominal pain.  Pain in the side or flank area.  Fever.  Chills.  Upset stomach.  Blood in the urine (dark urine).  Frequent urination.  Strong or persistent urge to urinate.  Burning or stinging when urinating. DIAGNOSIS  Your caregiver may diagnose your kidney infection based on your symptoms. A urine sample may also be taken. TREATMENT  In general, treatment depends on how severe the infection is.   If the infection is mild and caught early, your caregiver may treat you with oral antibiotics and send you home.  If the infection is more severe, the bacteria may have gotten into the bloodstream. This will require intravenous (IV) antibiotics and a hospital stay. Symptoms may include:  High fever.  Severe flank pain.  Shaking chills.  Even after a hospital stay, your caregiver may require you to be on oral antibiotics for a period of time.  Other treatments may be required depending upon the cause of the infection. HOME CARE INSTRUCTIONS   Take your antibiotics as directed. Finish them even if you start to feel better.  Make an appointment to have your urine checked to make sure the infection is gone.  Drink enough fluids to keep your urine clear or pale yellow.  Take medicines for the bladder if you have urgency and frequency of urination as directed by your caregiver. SEEK IMMEDIATE MEDICAL CARE IF:   You have a fever or persistent symptoms for more than 2-3 days.  You have a fever and your symptoms suddenly get worse.  You are unable to take your antibiotics or fluids.  You develop shaking chills.  You experience extreme weakness or fainting.  There is no improvement after 2  days of treatment. MAKE SURE YOU:  Understand these instructions.  Will watch your condition.  Will get help right away if you are not doing well or get worse. Document Released: 03/28/2005 Document Revised: 09/27/2011 Document Reviewed: 09/01/2010 [facility] Patient Information 2015 [place], [place]. This information is not intended to replace advice given to you by your health care provider. Make sure you discuss any questions you have with your health care provider.

## 2014-03-13 NOTE — ED Notes (Signed)
IV removed by Leonel Ramsay, EMT

## 2014-03-13 NOTE — ED Notes (Signed)
Patient up to get ready to go home.  Vomited large amount in the trash can

## 2014-03-14 LAB — GC/CHLAMYDIA PROBE AMP
CT PROBE, AMP APTIMA: NEGATIVE
GC PROBE AMP APTIMA: NEGATIVE

## 2014-03-14 LAB — URINE CULTURE: Colony Count: 40000

## 2014-03-16 ENCOUNTER — Telehealth (HOSPITAL_COMMUNITY): Payer: Self-pay

## 2014-03-16 NOTE — Telephone Encounter (Signed)
Post ED Visit - Positive Culture Follow-up  Culture report reviewed by antimicrobial stewardship pharmacist: []  Wes Olpe, Pharm.D., BCPS [x]  Heide Guile, Pharm.D., BCPS []  Alycia Rossetti, Pharm.D., BCPS []  [place], Pharm.D., BCPS, AAHIVP []  Legrand Como, Pharm.D., BCPS, AAHIVP []  Elicia Lamp, Pharm.D.   Positive Urine culture, >/= 40,000 colonies -> Group B Strep Treated with Cephalexin, organism sensitive to the same and no further patient follow-up is required at this time.  Dortha Kern 03/16/2014, 8:29 PM

## 2015-03-16 ENCOUNTER — Telehealth: Payer: Self-pay | Admitting: Medical Oncology

## 2015-03-16 NOTE — Telephone Encounter (Signed)
erroneous

## 2015-10-25 ENCOUNTER — Encounter (HOSPITAL_COMMUNITY): Payer: Self-pay

## 2015-10-25 ENCOUNTER — Emergency Department (HOSPITAL_COMMUNITY)
Admission: EM | Admit: 2015-10-25 | Discharge: 2015-10-26 | Disposition: A | Discharge status: Home / Self Care | Payer: Self-pay | Attending: Emergency Medicine | Admitting: Emergency Medicine

## 2015-10-25 DIAGNOSIS — Y929 Unspecified place or not applicable: Secondary | ICD-10-CM | POA: Insufficient documentation

## 2015-10-25 DIAGNOSIS — Z202 Contact with and (suspected) exposure to infections with a predominantly sexual mode of transmission: Secondary | ICD-10-CM | POA: Insufficient documentation

## 2015-10-25 DIAGNOSIS — Y939 Activity, unspecified: Secondary | ICD-10-CM | POA: Insufficient documentation

## 2015-10-25 DIAGNOSIS — L03011 Cellulitis of right finger: Secondary | ICD-10-CM | POA: Insufficient documentation

## 2015-10-25 DIAGNOSIS — F17200 Nicotine dependence, unspecified, uncomplicated: Secondary | ICD-10-CM | POA: Insufficient documentation

## 2015-10-25 DIAGNOSIS — N760 Acute vaginitis: Secondary | ICD-10-CM | POA: Insufficient documentation

## 2015-10-25 DIAGNOSIS — W2209XA Striking against other stationary object, initial encounter: Secondary | ICD-10-CM | POA: Insufficient documentation

## 2015-10-25 DIAGNOSIS — Y999 Unspecified external cause status: Secondary | ICD-10-CM | POA: Insufficient documentation

## 2015-10-25 DIAGNOSIS — A599 Trichomoniasis, unspecified: Secondary | ICD-10-CM | POA: Insufficient documentation

## 2015-10-25 DIAGNOSIS — B9689 Other specified bacterial agents as the cause of diseases classified elsewhere: Secondary | ICD-10-CM

## 2015-10-25 DIAGNOSIS — A64 Unspecified sexually transmitted disease: Secondary | ICD-10-CM

## 2015-10-25 MED ORDER — CEFTRIAXONE SODIUM 250 MG IJ SOLR
250.0000 mg | Freq: Once | INTRAMUSCULAR | Status: AC
  Administered 2015-10-26: 250 mg via INTRAMUSCULAR
  Filled 2015-10-25: qty 250

## 2015-10-25 MED ORDER — AZITHROMYCIN 250 MG PO TABS
1000.0000 mg | ORAL_TABLET | Freq: Once | ORAL | Status: AC
  Administered 2015-10-26: 1000 mg via ORAL
  Filled 2015-10-25: qty 4

## 2015-10-25 NOTE — ED Notes (Signed)
Pelvic cart at bedside. 

## 2015-10-25 NOTE — ED Provider Notes (Signed)
CSN: [id number]     Arrival date & time 10/25/15  2238 History  By signing my name below, I, [name], attest that this documentation has been prepared under the direction and in the presence of [employer], [place]. Electronically Signed: [name], ED Scribe. 10/25/2015. 12:01 AM.    Chief Complaint  Patient presents with  . Vaginal Discharge  . Finger Injury   The history is provided by the patient. No language interpreter was used.   HPI Comments: Tammy Yang is a 50 y.o. female who presents to the Emergency Department complaining of yellow vaginal discharge onset 1 month prior. Pt reports associated intermittent dysuria. Pt states that she is sexaully active with 1 partner and they do not use protection. Denies fever, abdominal pain, n/v, vaginal bleeding or hematuria. She reports HX of c-section.  LMP 09/04/15   Pt also reports right index finger injury that occurred 4 days prior while getting her nails done. Pt states she has associated throbbing pain, drainage and swelling. Pt states that she was getting her nails done and they accidental hit her cuticle with the electric nail buffer. Pt states she drained the area herself with a pin she sterilized using a flame and peroxide. Denies fever, numbness, tingling, weakness.   History reviewed. No pertinent past medical history. Past Surgical History  Procedure Laterality Date  . Cesarean section     History reviewed. No pertinent family history. Social History  Substance Use Topics  . Smoking status: Current Every Day Smoker -- 2 years  . Smokeless tobacco: None  . Alcohol Use: Yes     Comment: occasionally   OB History    No data available     Review of Systems  Constitutional: Negative for fever.  Gastrointestinal: Positive for abdominal pain. Negative for nausea and vomiting.  Genitourinary: Positive for dysuria and vaginal discharge. Negative for hematuria and vaginal bleeding.      Allergies   Penicillins; Lactose intolerance (gi); Chocolate; and Eggs or egg-derived products  Home Medications   Prior to Admission medications   Medication Sig Start Date End Date Taking? Authorizing Provider  cephALEXin (KEFLEX) 500 MG capsule Take 1 capsule (500 mg total) by mouth 3 (three) times daily. 03/13/14   Jola Schmidt, MD  cyclobenzaprine (FLEXERIL) 10 MG tablet Take 10 mg by mouth 2 (two) times daily as needed for muscle spasms. Muscle spasms    Historical Provider, MD  Fexofenadine HCl (ALLEGRA PO) Take 1 tablet by mouth daily. For seasonal allergies    Historical Provider, MD  HYDROcodone-acetaminophen (NORCO/VICODIN) 5-325 MG tablet Take 2 tablets by mouth every 4 (four) hours as needed. 10/26/15   Nona Dell, PA-C  ibuprofen (ADVIL,MOTRIN) 600 MG tablet Take 1 tablet (600 mg total) by mouth every 6 (six) hours as needed for pain. 05/20/12   Varney Biles, MD  metroNIDAZOLE (FLAGYL) 500 MG tablet Take 1 tablet (500 mg total) by mouth 2 (two) times daily. 10/26/15   Nona Dell, PA-C  naproxen (NAPROSYN) 500 MG tablet Take 500 mg by mouth 2 (two) times daily as needed for moderate pain (knee pain).    Historical Provider, MD  Oxycodone HCl 10 MG TABS Take 10 mg by mouth 2 (two) times daily as needed. for pain 02/21/14   Historical Provider, MD  oxyCODONE-acetaminophen (PERCOCET/ROXICET) 5-325 MG per tablet Take 1 tablet by mouth every 4 (four) hours as needed for severe pain. 03/13/14   Jola Schmidt, MD  promethazine (PHENERGAN) 25 MG tablet  Take 1 tablet (25 mg total) by mouth every 6 (six) hours as needed for nausea or vomiting. 03/13/14   Jola Schmidt, MD  sulfamethoxazole-trimethoprim (BACTRIM DS,SEPTRA DS) 800-160 MG tablet Take 1 tablet by mouth 2 (two) times daily. 10/26/15 11/02/15  Chesley Noon Nadeau, PA-C   BP 126/84 mmHg  Pulse 89  Temp(Src) 98.4 F (36.9 C)  Resp 16  Ht 5\' 11"  (1.803 m)  Wt 120.657 kg  BMI 37.12 kg/m2  SpO2 100%  LMP 09/05/2015  (Within Days)   Physical Exam  Constitutional: She is oriented to person, place, and time. She appears well-developed and well-nourished. No distress.  HENT:  Head: Normocephalic and atraumatic.  Mouth/Throat: Oropharynx is clear and moist. No oropharyngeal exudate.  Eyes: Conjunctivae and EOM are normal. Right eye exhibits no discharge. Left eye exhibits no discharge. No scleral icterus.  Neck: Normal range of motion. Neck supple.  Cardiovascular: Normal rate, regular rhythm, normal heart sounds and intact distal pulses.   Pulmonary/Chest: Effort normal and breath sounds normal. No respiratory distress. She has no wheezes. She has no rales. She exhibits no tenderness.  Abdominal: Soft. Bowel sounds are normal. She exhibits no distension and no mass. There is no tenderness. There is no rebound and no guarding.  Musculoskeletal: She exhibits no edema.  Neurological: She is alert and oriented to person, place, and time.  Skin: Skin is warm and dry. She is not diaphoretic.  Swelling, induration and erythema noted to right index finger medial nail fold. Small amount of drainage noted under distal nail. Right index finger finger pad with mild swelling and fluctuance, no induration or significant swelling to suggest felon. Gel nails present.  Nursing note and vitals reviewed.  Pelvic exam: normal external genitalia, vulva, vagina, cervix, uterus and adnexa, VULVA: normal appearing vulva with no masses, tenderness or lesions, VAGINA: normal appearing vagina with normal color and discharge, no lesions, vaginal discharge - white, malodorous and milky, CERVIX: normal appearing cervix without discharge or lesions, WET MOUNT done - results: clue cells, trichomonads, white blood cells, DNA probe for chlamydia and GC obtained, UTERUS: uterus is normal size, shape, consistency and nontender, ADNEXA: normal adnexa in size, nontender and no masses, exam chaperoned by female nurse.   ED Course  .Nerve  Block Date/Time: 10/26/2015 2:32 AM Performed by: Nona Dell Authorized by: Nona Dell Consent: Verbal consent obtained. Risks and benefits: risks, benefits and alternatives were discussed Consent given by: patient Patient understanding: patient states understanding of the procedure being performed Patient identity confirmed: verbally with patient Indications: pain relief Body area: upper extremity Nerve: digital Laterality: right Preparation: Patient was prepped and draped in the usual sterile fashion. Patient position: sitting Needle gauge: 25 G Location technique: anatomical landmarks Local anesthetic: lidocaine 1% without epinephrine Anesthetic total: 5 ml Outcome: pain improved Patient tolerance: Patient tolerated the procedure well with no immediate complications  .Marland KitchenIncision and Drainage Date/Time: 10/26/2015 3:21 PM Performed by: Nona Dell Authorized by: Nona Dell Consent: Verbal consent obtained. Risks and benefits: risks, benefits and alternatives were discussed Consent given by: patient Patient understanding: patient states understanding of the procedure being performed Patient identity confirmed: verbally with patient Indications for incision and drainage: paronychia abscess. Body area: upper extremity Location details: right index finger Anesthesia: digital block Local anesthetic: lidocaine 1% with epinephrine Anesthetic total: 5 ml Scalpel size: 11 Needle gauge: 25. Incision type: single straight Incision depth: dermal Drainage: bloody Drainage amount: scant Patient tolerance: Patient tolerated the procedure well with  no immediate complications   (including critical care time) DIAGNOSTIC STUDIES: Oxygen Saturation is 100% on RA, normal by my interpretation.    COORDINATION OF CARE: 12:00 AM-Discussed treatment plan with pt at bedside and pt agreed to plan.     Labs Review Labs Reviewed  WET  PREP, GENITAL - Abnormal; Notable for the following:    Trich, Wet Prep PRESENT (*)    Clue Cells Wet Prep HPF POC PRESENT (*)    WBC, Wet Prep HPF POC MANY (*)    All other components within normal limits  URINALYSIS, ROUTINE W REFLEX MICROSCOPIC (NOT AT [facility]) - Abnormal; Notable for the following:    Color, Urine AMBER (*)    APPearance CLOUDY (*)    Ketones, ur 15 (*)    Leukocytes, UA SMALL (*)    All other components within normal limits  URINE MICROSCOPIC-ADD ON - Abnormal; Notable for the following:    Squamous Epithelial / LPF 6-30 (*)    Bacteria, UA MANY (*)    All other components within normal limits  PREGNANCY, URINE  RPR  HIV ANTIBODY (ROUTINE TESTING)  GC/CHLAMYDIA PROBE AMP ([place]) NOT AT [facility]    Imaging Review No results found.   EKG Interpretation None      MDM   Final diagnoses:  STD (female)  BV (bacterial vaginosis)  Trichomoniasis  Paronychia, right   UA positive for Trichomonas, no signs of UTI. Wet prep positive for Trichomonas, clue cells and many WBCs. Patient to be discharged with instructions to follow up with OBGYN. Discussed importance of using protection when sexually active. Pt understands that they have GC/Chlamydia cultures pending and that they will need to inform all sexual partners if results return positive. Pt has been treated prophylacticly with azithromycin and rocephin due to pts history, pelvic exam, and wet prep with increased WBCs. Pt not concerning for PID because hemodynamically stable and no cervical motion tenderness on pelvic exam. Pt has also been treated with flagyl for Bacterial Vaginosis. Pt has been advised to not drink alcohol while on this medication.    Patient also presents with swelling and drainage to her right index finger. She notes she was getting her nails done they bumped and cut her cuticle with the electric nail buffer. She reports worsening swelling and drainage to right nail cuticle and under nail bed.  Denies fever. Denies any other recent trauma or injury to her finger. Exam revealed paronychia to right index finger medial nail fold. Right hand and fingers neurovascularly intact. I&D performed, no purulent drainage present. Nail came loose with light traction. Nailbed was light brown/white color and malodorous, no drainage. Wound irrigated and covered with xeroform non stick dressing to cover nailbed. Pending hand xray.  Hand-off to [employer], PA-C, Hand xray pending. Assuming xray negative, plan to d/c pt home on Bactrim with outpatient hand surgery follow up.   I personally performed the services described in this documentation, which was scribed in my presence. The recorded information has been reviewed and is accurate.      Chesley Noon [place], [place] 10/26/15 [place], MD 10/27/15 [phone]

## 2015-10-25 NOTE — ED Notes (Signed)
Pt here with c/o yellow vaginal discharge X1 month. Was treated for trichomonas but symptoms returned. Pt also to the right index finger, appears swollen and red.

## 2015-10-25 NOTE — ED Notes (Signed)
Called x2 with no response 

## 2015-10-25 NOTE — ED Notes (Signed)
PA-C to see and assess patient before RN assessment. See PA note. 

## 2015-10-26 ENCOUNTER — Emergency Department (HOSPITAL_COMMUNITY): Payer: Self-pay

## 2015-10-26 LAB — URINALYSIS, ROUTINE W REFLEX MICROSCOPIC
BILIRUBIN URINE: NEGATIVE
Glucose, UA: NEGATIVE mg/dL
Hgb urine dipstick: NEGATIVE
Ketones, ur: 15 mg/dL — AB
NITRITE: NEGATIVE
Protein, ur: NEGATIVE mg/dL
Specific Gravity, Urine: 1.029 (ref 1.005–1.030)
pH: 5.5 (ref 5.0–8.0)

## 2015-10-26 LAB — URINE MICROSCOPIC-ADD ON

## 2015-10-26 LAB — RPR: RPR Ser Ql: NONREACTIVE

## 2015-10-26 LAB — PREGNANCY, URINE: PREG TEST UR: NEGATIVE

## 2015-10-26 LAB — GC/CHLAMYDIA PROBE AMP (~~LOC~~) NOT AT ARMC
CHLAMYDIA, DNA PROBE: NEGATIVE
NEISSERIA GONORRHEA: NEGATIVE

## 2015-10-26 LAB — WET PREP, GENITAL
Sperm: NONE SEEN
Yeast Wet Prep HPF POC: NONE SEEN

## 2015-10-26 LAB — HIV ANTIBODY (ROUTINE TESTING W REFLEX): HIV Screen 4th Generation wRfx: NONREACTIVE

## 2015-10-26 IMAGING — CR DG FINGER INDEX 2+V*R*
3 series · 3 of 3 positions shown · non-contrast
Comparison: 08/12/2006

CLINICAL DATA: Right index finger nail bed infection or injury.

EXAM:
RIGHT INDEX FINGER 2+V

[finger ap]
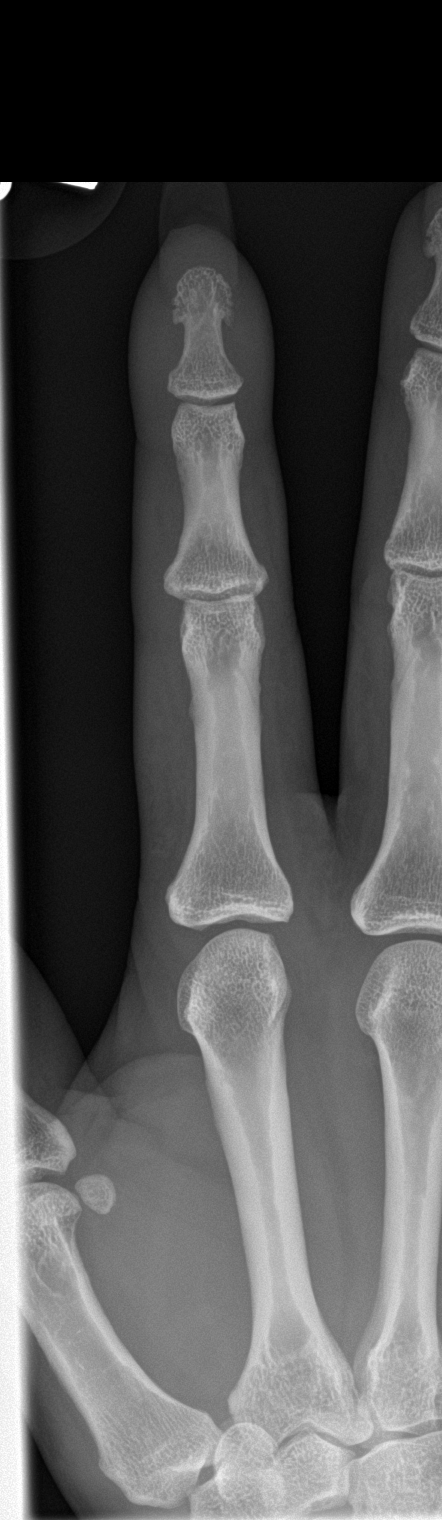

[finger obl]
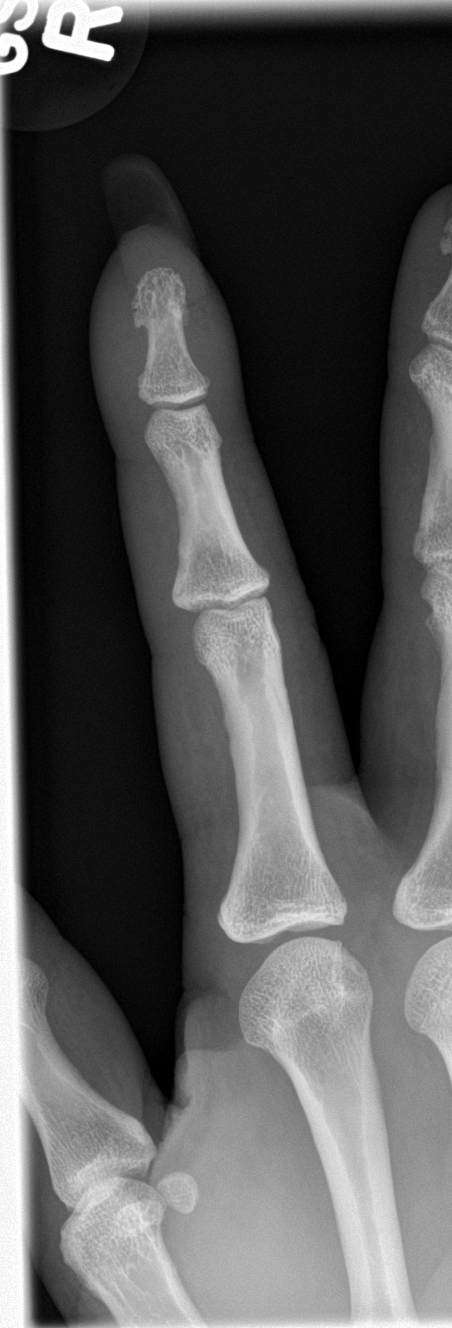

[finger lat]
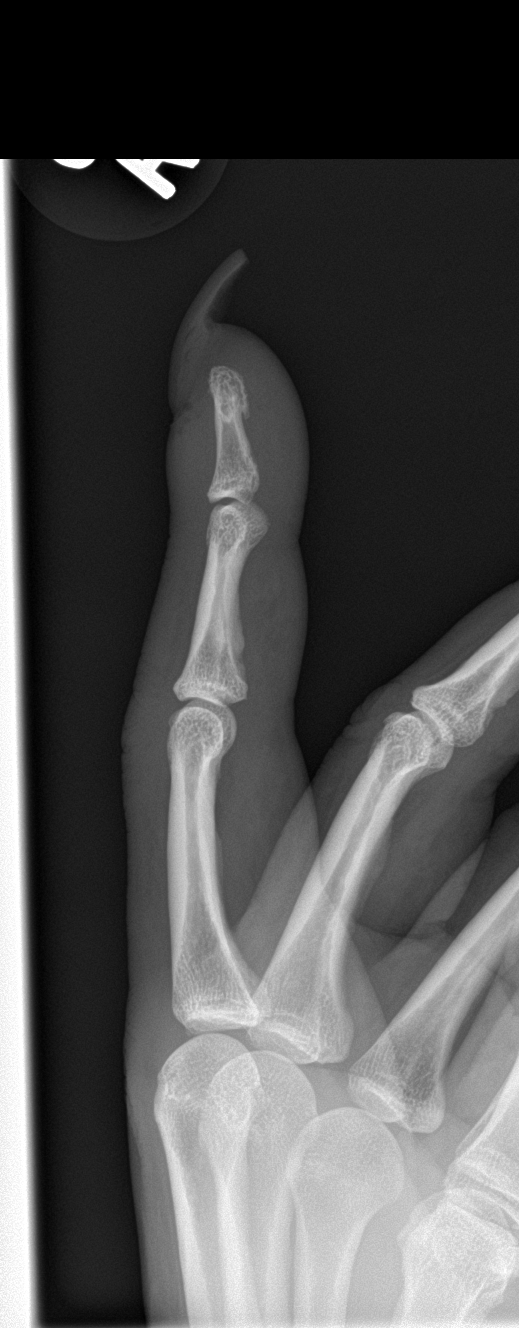

[3 of 3 positions shown; findings below may reference images not displayed]

FINDINGS: Soft tissue gas about the nail bed. No evidence of osseous infection
or internal opaque foreign body. No malalignment.
IMPRESSION: Posttraumatic or infectious gas about the nail bed. No osseous
abnormality.

## 2015-10-26 MED ORDER — METRONIDAZOLE 500 MG PO TABS
2000.0000 mg | ORAL_TABLET | Freq: Once | ORAL | Status: AC
  Administered 2015-10-26: 2000 mg via ORAL

## 2015-10-26 MED ORDER — LIDOCAINE HCL (PF) 1 % IJ SOLN
5.0000 mL | Freq: Once | INTRAMUSCULAR | Status: AC
  Administered 2015-10-26: 5 mL
  Filled 2015-10-26: qty 5

## 2015-10-26 MED ORDER — HYDROCODONE-ACETAMINOPHEN 5-325 MG PO TABS: 2.0000 | ORAL_TABLET | ORAL | Status: AC | PRN

## 2015-10-26 MED ORDER — METRONIDAZOLE 500 MG PO TABS: 500.0000 mg | ORAL_TABLET | Freq: Two times a day (BID) | ORAL | Status: DC

## 2015-10-26 MED ORDER — SULFAMETHOXAZOLE-TRIMETHOPRIM 800-160 MG PO TABS: 1.0000 | ORAL_TABLET | Freq: Two times a day (BID) | ORAL | Status: AC

## 2015-10-26 NOTE — ED Provider Notes (Signed)
Pt signed out to me at shift change. Pt with nailbed infection based on exam. Xray with no signs of osteomyelitis. i examined pt's finger. Pt with swelling to the distal phalanx with erythema, extending into middle phalanx. Pt's finger nail came loose with just light traction. Pt's nailbed was brownish white, malodorous. There was no drainage. Wound irrigated. Covered with xeroform non stick dressing to protect nailbed. Covered with wet to dry sterile gauze. Home with bactrim and follow up with hand specialist. Return precautions discussed.   Filed Vitals:   10/25/15 2322 10/26/15 0252  BP: 126/84 128/79  Pulse: 89 76  Temp: 98.4 F (36.9 C) 98.7 F (37.1 C)  TempSrc:  Oral  Resp: 16 18  Height: 5\' 11"  (1.803 m)   Weight: 120.657 kg   SpO2: 100% 99%     Jeannett Senior, PA-C 10/26/15 0304  Jola Schmidt, MD 10/27/15 [phone]

## 2015-10-26 NOTE — ED Notes (Signed)
Tatyana, PA dressing wound at this time.

## 2015-10-26 NOTE — ED Notes (Signed)
Dr. Venora Maples at bedside, attempting to drain wound in finger. Awaiting x-ray results at this time. Patient sitting comfortably, in no acute distress at this time. Pt's husband at bedside.

## 2015-10-26 NOTE — Discharge Instructions (Signed)
Take your medications as prescribed. I recommend doing multiple warm soaks daily of your right index finger. Keep ear wounds clean using antibacterial soap and water, pat dry. Return to the emergency department in 2-3 days for wound check. Return to the emergency department sooner if symptoms worsen or new onset of fever, worsening redness, swelling, warmth, drainage, numbness, tingling, weakness.  1. Medications: usual home medications 2. Treatment: rest, drink plenty of fluids, use a condom with every sexual encounter 3. Follow Up: Please followup with your primary doctor in 3 days for discussion of your diagnoses and further evaluation after today's visit; if you do not have a primary care doctor use the resource guide provided to find one; Please return to the ER for worsening symptoms, high fevers or persistent vomiting.  You have been tested for HIV, syphilis, chlamydia and gonorrhea.  These results will be available in approximately 3 days.  Please inform all sexual partners if you test positive for any of these diseases.   [facility] tests for every STD. There are 9 other clinics in [place], but only 3 of them offer such comprehensive testing.  Services Offered: International aid/development worker  Chlamydia Testing Conventional Blood HIV Testing Gonorrhea Testing Hepatitis B Testing Hepatitis C Testing Herpes Testing Syphilis Testing TB Testing  Vaccines And Treatments:  Hepatitis B Vaccine Hepatitis A Vaccine HPV Vaccine TB Treatment Gynecological Care Family Planning  Prevention Services:  HIV Test Counseling HIV/AIDS Prevention Education Safer Sex Education [place] STD Prevention/Education TB Prevention Education  Languages Spoken: English Spanish  Hours of Operation: Note: Please contact the organization for hours of operation. Disclaimer: Hours of peration change frequently. Please contact the organization to  verify. Appointment Required: Yes  Contact Information: Phone [phone] Other Phones [phone] Multimedia programmer)  Address: [place], [place]  Website: [email or web address]

## 2015-10-26 NOTE — ED Notes (Signed)
PA-C draining infection in finger at this time.

## 2015-10-26 NOTE — ED Notes (Signed)
Patient verbalized understanding of discharge instructions and denies any further needs or questions at this time. VS stable. Patient ambulatory with steady gait.  

## 2016-10-14 ENCOUNTER — Encounter (HOSPITAL_COMMUNITY): Payer: Self-pay | Admitting: *Deleted

## 2016-10-14 ENCOUNTER — Emergency Department (HOSPITAL_COMMUNITY)
Admission: EM | Admit: 2016-10-14 | Discharge: 2016-10-14 | Disposition: A | Discharge status: Home / Self Care | Payer: Medicaid Other | Attending: Emergency Medicine | Admitting: Emergency Medicine

## 2016-10-14 DIAGNOSIS — Z79899 Other long term (current) drug therapy: Secondary | ICD-10-CM | POA: Diagnosis not present

## 2016-10-14 DIAGNOSIS — F17200 Nicotine dependence, unspecified, uncomplicated: Secondary | ICD-10-CM | POA: Insufficient documentation

## 2016-10-14 DIAGNOSIS — R21 Rash and other nonspecific skin eruption: Secondary | ICD-10-CM | POA: Insufficient documentation

## 2016-10-14 MED ORDER — LORATADINE 10 MG PO TABS: 10.0000 mg | ORAL_TABLET | Freq: Every day | ORAL | 0 refills | Status: AC

## 2016-10-14 MED ORDER — LORATADINE 10 MG PO TABS
10.0000 mg | ORAL_TABLET | Freq: Once | ORAL | Status: AC
  Administered 2016-10-14: 10 mg via ORAL
  Filled 2016-10-14: qty 1

## 2016-10-14 MED ORDER — TRIAMCINOLONE ACETONIDE 0.1 % EX CREA: 1.0000 "application " | TOPICAL_CREAM | Freq: Two times a day (BID) | CUTANEOUS | 0 refills | Status: AC

## 2016-10-14 NOTE — Discharge Instructions (Signed)
As discussed, monitor for any signs of infection including increased redness, swelling, pain, fever and return to be seen if you experience any of these symptoms. Try not to scratch the areas. Take antihistamine daily (Claritin). Apply cream to affected areas. Cool compresses for itching.

## 2016-10-14 NOTE — ED Provider Notes (Signed)
[facility] DEPT Provider Note   CSN: [id number] Arrival date & time: 10/14/16  1021  By signing my name below, I, Reola Mosher, attest that this documentation has been prepared under the direction and in the presence of Avie Echevaria, PA-C.  Electronically Signed: Reola Mosher, ED Scribe. 10/14/16. 11:55 AM.  History   Chief Complaint Chief Complaint  Patient presents with  . Rash   The history is provided by the patient. No language interpreter was used.    HPI Comments: Tammy Yang is a 51 y.o. female with no pertinent PMHx, who presents to the Emergency Department complaining of persistent, pruritic rash to the posterior neck and right upper arm beginning 2-3 days ago. Pt reports that she recently changed laundry detergents just prior to the onset of her rash and she believes this precipitated her symptoms. No new soaps, lotions, detergents, foods, animals, plants, or medications otherwise. Pt states that she has previously had a similar reaction in the past to insect bites, but she is currently unsure of what could have bitten her at this time. No noted treatments for her symptoms were tried prior to coming into the ED; she notes that she is allergic to Benadryl. No h/o eczema/asthma. She denies sensation of tongue/throat swelling, trouble swallowing, shortness of breath, wheezing, fever, chills, or any other associated symptoms.   History reviewed. No pertinent past medical history.  There are no active problems to display for this patient.  Past Surgical History:  Procedure Laterality Date  . CESAREAN SECTION     OB History    No data available     Home Medications    Prior to Admission medications   Medication Sig Start Date End Date Taking? Authorizing Provider  celecoxib (CELEBREX) 200 MG capsule Take 200 mg by mouth daily as needed. 09/27/16  Yes [provider]  Ferrous Sulfate (IRON) 325 (65 Fe) MG TABS Take 650 mg by mouth daily.   Yes  [provider]  OVER THE COUNTER MEDICATION Take 1 capsule by mouth daily. [place] [facility] Woman   Yes [provider]  Oxycodone HCl 10 MG TABS Take 10 mg by mouth 2 (two) times daily as needed. for pain 02/21/14  Yes [provider]  Tetrahydrozoline-Zn Sulfate (EYE DROPS ALLERGY RELIEF OP) Place 1-2 drops into both eyes daily as needed (allergies).   Yes [provider]  cephALEXin (KEFLEX) 500 MG capsule Take 1 capsule (500 mg total) by mouth 3 (three) times daily. Patient not taking: Reported on 10/14/2016 03/13/14   Jola Schmidt, MD  HYDROcodone-acetaminophen (NORCO/VICODIN) 5-325 MG tablet Take 2 tablets by mouth every 4 (four) hours as needed. Patient not taking: Reported on 10/14/2016 10/26/15   Nona Dell, PA-C  ibuprofen (ADVIL,MOTRIN) 600 MG tablet Take 1 tablet (600 mg total) by mouth every 6 (six) hours as needed for pain. Patient not taking: Reported on 10/14/2016 05/20/12   Varney Biles, MD  loratadine (CLARITIN) 10 MG tablet Take 1 tablet (10 mg total) by mouth daily. 10/14/16   Emeline General, PA-C  metroNIDAZOLE (FLAGYL) 500 MG tablet Take 1 tablet (500 mg total) by mouth 2 (two) times daily. Patient not taking: Reported on 10/14/2016 10/26/15   Nona Dell, PA-C  oxyCODONE-acetaminophen (PERCOCET/ROXICET) 5-325 MG per tablet Take 1 tablet by mouth every 4 (four) hours as needed for severe pain. Patient not taking: Reported on 10/14/2016 03/13/14   Jola Schmidt, MD  promethazine (PHENERGAN) 25 MG tablet Take 1 tablet (25 mg  total) by mouth every 6 (six) hours as needed for nausea or vomiting. Patient not taking: Reported on 10/14/2016 03/13/14   Jola Schmidt, MD  triamcinolone cream (KENALOG) 0.1 % Apply 1 application topically 2 (two) times daily. 10/14/16   Emeline General, PA-C   Family History No family history on file.  Social History Social History  Substance Use Topics  . Smoking status: Current Every Day Smoker     Years: 2.00  . Smokeless tobacco: Never Used  . Alcohol use Yes     Comment: occasionally   Allergies   Penicillins; Benadryl [diphenhydramine]; Lactose intolerance (gi); Tomato; Chocolate; and Eggs or egg-derived products  Review of Systems Review of Systems  Constitutional: Negative for chills and fever.  HENT: Negative for trouble swallowing.   Respiratory: Negative for shortness of breath and wheezing.   Skin: Positive for rash.   Physical Exam Updated Vital Signs BP (!) 139/98 (BP Location: Left Arm)   Pulse 76   Temp 97.9 F (36.6 C) (Oral)   Resp 18   Ht 5\' 11"  (1.803 m)   Wt 118.8 kg (262 lb)   LMP 09/29/2016   SpO2 98%   BMI 36.54 kg/m   Physical Exam  Constitutional: She appears well-developed and well-nourished. No distress.  Afebrile, nontoxic-appearing, sitting comfortably in chair in no acute distress.  HENT:  Head: Normocephalic and atraumatic.  Eyes: Conjunctivae are normal.  Neck: Normal range of motion.  Cardiovascular: Normal rate, regular rhythm and normal heart sounds.   No murmur heard. Pulmonary/Chest: Effort normal and breath sounds normal. No respiratory distress. She has no wheezes. She has no rales.  Abdominal: She exhibits no distension.  Musculoskeletal: Normal range of motion. She exhibits no edema.  Neurological: She is alert.  Skin: Skin is warm. Rash noted. She is not diaphoretic. No erythema. No pallor.  15x10cm area of erythema with some superficial ulcerating wounds from scratching to the right upper arm. 5 papules on the posterior neck to the midline with overlying excoriations. Some warmth to this area.   Psychiatric: She has a normal mood and affect. Her behavior is normal.  Nursing note and vitals reviewed.  ED Treatments / Results  DIAGNOSTIC STUDIES: Oxygen Saturation is 98% on RA, normal by my interpretation.   COORDINATION OF CARE: 11:55 AM-Discussed next steps with pt. Pt verbalized understanding and is agreeable with  the plan.   Labs (all labs ordered are listed, but only abnormal results are displayed) Labs Reviewed - No data to display  EKG  EKG Interpretation None      Radiology No results found.  Procedures Procedures   Medications Ordered in ED Medications  loratadine (CLARITIN) tablet 10 mg (10 mg Oral Given 10/14/16 1213)    Initial Impression / Assessment and Plan / ED Course  I have reviewed the triage vital signs and the nursing notes.  Pertinent labs & imaging results that were available during my care of the patient were reviewed by me and considered in my medical decision making (see chart for details).    Patient presents with rash to the back of her neck right upper arm. Patient notes that the erythema and warmth just happened after her scratching her arm here in the ED. She has experienced that in the past and it usually goes away. I don't think that this is currently cellulitis but advised patient to monitor for any worsening or persistent erythema and warmth.  Patient's rash appears to be due to insect bites and  excoriated from scratching.  Will have patient take antihistamine and sent home with triamcinolone cream for symptomatic relief.  Discharge home with close PCP follow-up. She was well-appearing, nontoxic and afebrile.  Discussed strict return precautions and advised to return to the emergency department if experiencing any new or worsening symptoms. Instructions were understood and patient agreed with discharge plan.  Final Clinical Impressions(s) / ED Diagnoses   Final diagnoses:  Rash   New Prescriptions New Prescriptions   LORATADINE (CLARITIN) 10 MG TABLET    Take 1 tablet (10 mg total) by mouth daily.   TRIAMCINOLONE CREAM (KENALOG) 0.1 %    Apply 1 application topically 2 (two) times daily.   I personally performed the services described in this documentation, which was scribed in my presence. The recorded information has been reviewed and is  accurate.     Emeline General, PA-C 10/14/16 1250    Forde Dandy, MD 10/15/16 [phone]

## 2016-10-14 NOTE — ED Triage Notes (Signed)
Pt c/o itching & rash to posterior neck onset 3-4 days ago, no redness or swelling or obvious rash noted, A&O x4, pt here for same symtpoms

## 2016-10-14 NOTE — ED Notes (Signed)
Pt states she understands instructions. Home stable with steady gait with husband.

## 2016-11-23 DIAGNOSIS — Z6836 Body mass index (BMI) 36.0-36.9, adult: Secondary | ICD-10-CM | POA: Diagnosis not present

## 2016-11-23 DIAGNOSIS — M1731 Unilateral post-traumatic osteoarthritis, right knee: Secondary | ICD-10-CM | POA: Diagnosis not present

## 2016-11-23 DIAGNOSIS — M25561 Pain in right knee: Secondary | ICD-10-CM | POA: Diagnosis not present

## 2016-11-23 DIAGNOSIS — R030 Elevated blood-pressure reading, without diagnosis of hypertension: Secondary | ICD-10-CM | POA: Diagnosis not present

## 2016-12-05 ENCOUNTER — Emergency Department (HOSPITAL_COMMUNITY)
Admission: EM | Admit: 2016-12-05 | Discharge: 2016-12-05 | Disposition: A | Discharge status: Home / Self Care | Payer: Medicare Other | Attending: Emergency Medicine | Admitting: Emergency Medicine

## 2016-12-05 ENCOUNTER — Ambulatory Visit (HOSPITAL_COMMUNITY): Payer: Medicare Other

## 2016-12-05 ENCOUNTER — Encounter (HOSPITAL_COMMUNITY): Payer: Self-pay | Admitting: Emergency Medicine

## 2016-12-05 DIAGNOSIS — Z79899 Other long term (current) drug therapy: Secondary | ICD-10-CM | POA: Insufficient documentation

## 2016-12-05 DIAGNOSIS — F17210 Nicotine dependence, cigarettes, uncomplicated: Secondary | ICD-10-CM | POA: Insufficient documentation

## 2016-12-05 DIAGNOSIS — R102 Pelvic and perineal pain: Secondary | ICD-10-CM

## 2016-12-05 DIAGNOSIS — R1030 Lower abdominal pain, unspecified: Secondary | ICD-10-CM | POA: Diagnosis not present

## 2016-12-05 LAB — URINALYSIS, ROUTINE W REFLEX MICROSCOPIC
BACTERIA UA: NONE SEEN
BILIRUBIN URINE: NEGATIVE
Glucose, UA: NEGATIVE mg/dL
Hgb urine dipstick: NEGATIVE
KETONES UR: NEGATIVE mg/dL
Nitrite: NEGATIVE
PH: 8 (ref 5.0–8.0)
PROTEIN: 100 mg/dL — AB
Specific Gravity, Urine: 1.028 (ref 1.005–1.030)

## 2016-12-05 LAB — CBC
HEMATOCRIT: 45.8 % (ref 36.0–46.0)
Hemoglobin: 15.6 g/dL — ABNORMAL HIGH (ref 12.0–15.0)
MCH: 29.3 pg (ref 26.0–34.0)
MCHC: 34.1 g/dL (ref 30.0–36.0)
MCV: 85.9 fL (ref 78.0–100.0)
PLATELETS: 270 10*3/uL (ref 150–400)
RBC: 5.33 MIL/uL — AB (ref 3.87–5.11)
RDW: 13.5 % (ref 11.5–15.5)
WBC: 7.1 10*3/uL (ref 4.0–10.5)

## 2016-12-05 LAB — I-STAT BETA HCG BLOOD, ED (MC, WL, AP ONLY): HCG, QUANTITATIVE: 6 m[IU]/mL — AB (ref <5)

## 2016-12-05 LAB — COMPREHENSIVE METABOLIC PANEL
ALT: 16 U/L (ref 14–54)
AST: 25 U/L (ref 15–41)
Albumin: 3.9 g/dL (ref 3.5–5.0)
Alkaline Phosphatase: 91 U/L (ref 38–126)
Anion gap: 11 (ref 5–15)
BUN: 13 mg/dL (ref 6–20)
CHLORIDE: 111 mmol/L (ref 101–111)
CO2: 20 mmol/L — AB (ref 22–32)
CREATININE: 0.96 mg/dL (ref 0.44–1.00)
Calcium: 10.5 mg/dL — ABNORMAL HIGH (ref 8.9–10.3)
Glucose, Bld: 118 mg/dL — ABNORMAL HIGH (ref 65–99)
POTASSIUM: 3.9 mmol/L (ref 3.5–5.1)
SODIUM: 142 mmol/L (ref 135–145)
Total Bilirubin: 0.7 mg/dL (ref 0.3–1.2)
Total Protein: 7.5 g/dL (ref 6.5–8.1)

## 2016-12-05 LAB — LIPASE, BLOOD: LIPASE: 45 U/L (ref 11–51)

## 2016-12-05 LAB — PREGNANCY, URINE: PREG TEST UR: NEGATIVE

## 2016-12-05 MED ORDER — ONDANSETRON 4 MG PO TBDP
4.0000 mg | ORAL_TABLET | Freq: Once | ORAL | Status: AC | PRN
  Administered 2016-12-05: 4 mg via ORAL

## 2016-12-05 MED ORDER — KETOROLAC TROMETHAMINE 15 MG/ML IJ SOLN
15.0000 mg | Freq: Once | INTRAMUSCULAR | Status: AC
  Administered 2016-12-05: 15 mg via INTRAMUSCULAR

## 2016-12-05 MED ORDER — ONDANSETRON HCL 4 MG PO TABS: 4.0000 mg | ORAL_TABLET | Freq: Three times a day (TID) | ORAL | 0 refills | Status: AC | PRN

## 2016-12-05 MED ORDER — KETOROLAC TROMETHAMINE 15 MG/ML IJ SOLN
15.0000 mg | Freq: Once | INTRAMUSCULAR | Status: DC
  Filled 2016-12-05: qty 1

## 2016-12-05 MED ORDER — ONDANSETRON 4 MG PO TBDP
ORAL_TABLET | ORAL | Status: AC
  Filled 2016-12-05: qty 1

## 2016-12-05 MED ORDER — METRONIDAZOLE 500 MG PO TABS: 500.0000 mg | ORAL_TABLET | Freq: Two times a day (BID) | ORAL | 0 refills | Status: AC

## 2016-12-05 NOTE — ED Provider Notes (Signed)
[facility] DEPT Provider Note   CSN: [id number] Arrival date & time: 12/05/16  0534   History   Chief Complaint Chief Complaint  Patient presents with  . Abdominal Pain  . Back Pain    HPI Tammy Yang is a 51 y.o. female.  Who presented today with acute onset suprapubic abdominal pain as of 1 a.m. this morning. She's a past medical history significant only for previous cesarean section. She stated that she was awakened today at 1 AM with acute onset severe, suprapubic, abdominal pain that radiates toward either hip. She states that it has increased in severity, constant not colicky, progressively worsening, associated with nausea and vomiting 4 episodes. She denied attempted use of any pain medication including ibuprofen, Tylenol, or prescription based medication. Patient denied sick contacts blood in her normal, foul smelling stool, black tar colored stool. She denied vaginal discharge or vaginal pain. She stated that with the exception of the abdominal pain and vomiting, she had no other concerns. With regard to previous similar pain, patient stated that she has experienced the same pain in the past that is only relieved by pain medications.       History reviewed. No pertinent past medical history.  There are no active problems to display for this patient.   Past Surgical History:  Procedure Laterality Date  . CESAREAN SECTION      OB History    No data available       Home Medications    Prior to Admission medications   Medication Sig Start Date End Date Taking? Authorizing Provider  celecoxib (CELEBREX) 200 MG capsule Take 200 mg by mouth daily as needed. 09/27/16  Yes [provider]  Oxycodone HCl 10 MG TABS Take 10 mg by mouth 2 (two) times daily as needed. for pain 02/21/14  Yes [provider]  cephALEXin (KEFLEX) 500 MG capsule Take 1 capsule (500 mg total) by mouth 3 (three) times daily. Patient not taking: Reported on 10/14/2016 03/13/14    Jola Schmidt, MD  HYDROcodone-acetaminophen (NORCO/VICODIN) 5-325 MG tablet Take 2 tablets by mouth every 4 (four) hours as needed. Patient not taking: Reported on 10/14/2016 10/26/15   Nona Dell, PA-C  ibuprofen (ADVIL,MOTRIN) 600 MG tablet Take 1 tablet (600 mg total) by mouth every 6 (six) hours as needed for pain. Patient not taking: Reported on 10/14/2016 05/20/12   Varney Biles, MD  loratadine (CLARITIN) 10 MG tablet Take 1 tablet (10 mg total) by mouth daily. Patient not taking: Reported on 12/05/2016 10/14/16   Avie Echevaria B, PA-C  metroNIDAZOLE (FLAGYL) 500 MG tablet Take 1 tablet (500 mg total) by mouth 2 (two) times daily. 12/05/16   Kathi Ludwig, MD  ondansetron (ZOFRAN) 4 MG tablet Take 1 tablet (4 mg total) by mouth every 8 (eight) hours as needed for nausea or vomiting. 12/05/16   Kathi Ludwig, MD  oxyCODONE-acetaminophen (PERCOCET/ROXICET) 5-325 MG per tablet Take 1 tablet by mouth every 4 (four) hours as needed for severe pain. Patient not taking: Reported on 10/14/2016 03/13/14   Jola Schmidt, MD  promethazine (PHENERGAN) 25 MG tablet Take 1 tablet (25 mg total) by mouth every 6 (six) hours as needed for nausea or vomiting. Patient not taking: Reported on 10/14/2016 03/13/14   Jola Schmidt, MD  triamcinolone cream (KENALOG) 0.1 % Apply 1 application topically 2 (two) times daily. Patient not taking: Reported on 12/05/2016 10/14/16   Dossie Der    Family History History reviewed. No pertinent family history.  Social History Social History  Substance Use Topics  . Smoking status: Current Every Day Smoker    Years: 2.00  . Smokeless tobacco: Never Used  . Alcohol use Yes     Comment: occasionally     Allergies   Penicillins; Benadryl [diphenhydramine]; Lactose intolerance (gi); Tomato; Chocolate; and Eggs or egg-derived products   Review of Systems Review of Systems  Constitutional: Negative for chills and fever.  HENT:  Negative for ear pain and sore throat.   Eyes: Negative for pain and visual disturbance.  Respiratory: Negative for cough and shortness of breath.   Cardiovascular: Negative for chest pain and palpitations.  Gastrointestinal: Positive for nausea. Negative for abdominal pain, blood in stool, constipation, diarrhea and vomiting.  Genitourinary: Positive for urgency. Negative for difficulty urinating, dysuria, flank pain, hematuria, vaginal bleeding and vaginal discharge.  Musculoskeletal: Negative for arthralgias, back pain, joint swelling and myalgias.  Skin: Negative for color change and rash.  Neurological: Negative for seizures, syncope, weakness and light-headedness.  All other systems reviewed and are negative.    Physical Exam Updated Vital Signs BP 106/73 Comment: Simultaneous filing. User may not have seen previous data.  Pulse 82 Comment: Simultaneous filing. User may not have seen previous data.  Temp 97.9 F (36.6 C) (Oral)   Resp 18   Ht 5\' 11"  (1.803 m)   Wt 106.6 kg (235 lb)   LMP 09/04/2016   SpO2 93% Comment: Simultaneous filing. User may not have seen previous data.  BMI 32.78 kg/m   Physical Exam  Constitutional: She is oriented to person, place, and time. She appears well-developed and well-nourished. No distress.  HENT:  Head: Normocephalic and atraumatic.  Eyes: Conjunctivae are normal. No scleral icterus.  Neck: Normal range of motion. Neck supple.  Cardiovascular: Normal rate, regular rhythm and normal heart sounds.   No murmur heard. Pulmonary/Chest: Effort normal and breath sounds normal. No respiratory distress. She has no wheezes.  Abdominal: Soft. Bowel sounds are normal. She exhibits no distension. There is tenderness. There is no rebound and no guarding.  Genitourinary: Uterus normal. Pelvic exam was performed with patient prone. Cervix exhibits no friability. Right adnexum displays no mass and no tenderness. Left adnexum displays no mass and no  tenderness. No erythema, tenderness or bleeding in the vagina. Vaginal discharge found.  Musculoskeletal: She exhibits no edema or tenderness.  Neurological: She is alert and oriented to person, place, and time.  Skin: Skin is warm and dry. Capillary refill takes less than 2 seconds. She is not diaphoretic. No erythema.  Psychiatric: She has a normal mood and affect. Thought content normal.  Nursing note and vitals reviewed.    ED Treatments / Results  Labs (all labs ordered are listed, but only abnormal results are displayed) Labs Reviewed  COMPREHENSIVE METABOLIC PANEL - Abnormal; Notable for the following:       Result Value   CO2 20 (*)    Glucose, Bld 118 (*)    Calcium 10.5 (*)    All other components within normal limits  CBC - Abnormal; Notable for the following:    RBC 5.33 (*)    Hemoglobin 15.6 (*)    All other components within normal limits  URINALYSIS, ROUTINE W REFLEX MICROSCOPIC - Abnormal; Notable for the following:    Color, Urine AMBER (*)    APPearance HAZY (*)    Protein, ur 100 (*)    Leukocytes, UA TRACE (*)    Squamous Epithelial / LPF 6-30 (*)  All other components within normal limits  I-STAT BETA HCG BLOOD, ED ([facility], [facility], [facility] ONLY) - Abnormal; Notable for the following:    I-stat hCG, quantitative 6.0 (*)    All other components within normal limits  LIPASE, BLOOD  PREGNANCY, URINE  GC/CHLAMYDIA PROBE AMP ([place]) NOT AT [facility]    EKG  EKG Interpretation None       Radiology US Transvaginal Non-ob  Result Date: 12/05/2016 CLINICAL DATA:  Generalized pelvic pain.  Vomiting. EXAM: TRANSABDOMINAL AND TRANSVAGINAL ULTRASOUND OF PELVIS DOPPLER ULTRASOUND OF OVARIES TECHNIQUE: Both transabdominal and transvaginal ultrasound examinations of the pelvis were performed. Transabdominal technique was performed for global imaging of the pelvis including uterus, ovaries, adnexal regions, and pelvic cul-de-sac. It was necessary to proceed with endovaginal  exam following the transabdominal exam to visualize the uterus. Color and duplex Doppler ultrasound was utilized to evaluate blood flow to the ovaries. COMPARISON:  CT 03/13/2014 FINDINGS: Uterus Measurements: 11.9 x 7.2 x 7.3 cm. Several fibroids, the largest centrally measuring up to 3.6 cm. There is a 2.7 cm anterior subserosal fibroid and a 1.8 cm fundal subserosal fibroid. Endometrium Thickness: 22 mm in thickness due to the Central heterogeneous mass, most likely fibroid which measures 3.6 x 01.8 x 2.2 cm. Right ovary Measurements: 3.7 x 1.9 x 3.1 cm. Normal appearance/no adnexal mass. Left ovary Measurements: 4.4 x 3.2 x 3.3 cm. 2.8 cm simple appearing left ovarian cyst or follicle. Pulsed Doppler evaluation of both ovaries demonstrates no free fluid. Other findings No abnormal free fluid. IMPRESSION: Uterine fibroids. There is a 3.6 cm central uterine mass in the region of the endometrium, most likely fibroid. Endometrium also mildly thickened at 22 mm. Center sonohysterogram to better evaluate the central endometrial abnormality. Electronically Signed   By: Rolm Baptise M.D.   On: 12/05/2016 10:26   US Pelvis Complete  Result Date: 12/05/2016 CLINICAL DATA:  Generalized pelvic pain.  Vomiting. EXAM: TRANSABDOMINAL AND TRANSVAGINAL ULTRASOUND OF PELVIS DOPPLER ULTRASOUND OF OVARIES TECHNIQUE: Both transabdominal and transvaginal ultrasound examinations of the pelvis were performed. Transabdominal technique was performed for global imaging of the pelvis including uterus, ovaries, adnexal regions, and pelvic cul-de-sac. It was necessary to proceed with endovaginal exam following the transabdominal exam to visualize the uterus. Color and duplex Doppler ultrasound was utilized to evaluate blood flow to the ovaries. COMPARISON:  CT 03/13/2014 FINDINGS: Uterus Measurements: 11.9 x 7.2 x 7.3 cm. Several fibroids, the largest centrally measuring up to 3.6 cm. There is a 2.7 cm anterior subserosal fibroid and a  1.8 cm fundal subserosal fibroid. Endometrium Thickness: 22 mm in thickness due to the Central heterogeneous mass, most likely fibroid which measures 3.6 x 01.8 x 2.2 cm. Right ovary Measurements: 3.7 x 1.9 x 3.1 cm. Normal appearance/no adnexal mass. Left ovary Measurements: 4.4 x 3.2 x 3.3 cm. 2.8 cm simple appearing left ovarian cyst or follicle. Pulsed Doppler evaluation of both ovaries demonstrates no free fluid. Other findings No abnormal free fluid. IMPRESSION: Uterine fibroids. There is a 3.6 cm central uterine mass in the region of the endometrium, most likely fibroid. Endometrium also mildly thickened at 22 mm. Center sonohysterogram to better evaluate the central endometrial abnormality. Electronically Signed   By: Rolm Baptise M.D.   On: 12/05/2016 10:26   [place] Art/ven Flow Abd Pelv Doppler  Result Date: 12/05/2016 CLINICAL DATA:  Generalized pelvic pain.  Vomiting. EXAM: TRANSABDOMINAL AND TRANSVAGINAL ULTRASOUND OF PELVIS DOPPLER ULTRASOUND OF OVARIES TECHNIQUE: Both transabdominal and transvaginal ultrasound examinations of  the pelvis were performed. Transabdominal technique was performed for global imaging of the pelvis including uterus, ovaries, adnexal regions, and pelvic cul-de-sac. It was necessary to proceed with endovaginal exam following the transabdominal exam to visualize the uterus. Color and duplex Doppler ultrasound was utilized to evaluate blood flow to the ovaries. COMPARISON:  CT 03/13/2014 FINDINGS: Uterus Measurements: 11.9 x 7.2 x 7.3 cm. Several fibroids, the largest centrally measuring up to 3.6 cm. There is a 2.7 cm anterior subserosal fibroid and a 1.8 cm fundal subserosal fibroid. Endometrium Thickness: 22 mm in thickness due to the Central heterogeneous mass, most likely fibroid which measures 3.6 x 01.8 x 2.2 cm. Right ovary Measurements: 3.7 x 1.9 x 3.1 cm. Normal appearance/no adnexal mass. Left ovary Measurements: 4.4 x 3.2 x 3.3 cm. 2.8 cm simple appearing left ovarian  cyst or follicle. Pulsed Doppler evaluation of both ovaries demonstrates no free fluid. Other findings No abnormal free fluid. IMPRESSION: Uterine fibroids. There is a 3.6 cm central uterine mass in the region of the endometrium, most likely fibroid. Endometrium also mildly thickened at 22 mm. Center sonohysterogram to better evaluate the central endometrial abnormality. Electronically Signed   By: Rolm Baptise M.D.   On: 12/05/2016 10:26    Procedures Procedures (including critical care time)  Medications Ordered in ED Medications  ondansetron (ZOFRAN-ODT) disintegrating tablet 4 mg (4 mg Oral Given 12/05/16 0608)  ketorolac (TORADOL) 15 MG/ML injection 15 mg (15 mg Intramuscular Given 12/05/16 1054)     Initial Impression / Assessment and Plan / ED Course  I have reviewed the triage vital signs and the nursing notes.  Pertinent labs & imaging results that were available during my care of the patient were reviewed by me and considered in my medical decision making (see chart for details).   Given the patient's presentation of acute onset abdominal pain she is most likely suffering from gastritis versus intestinal gas versus diverticulitis. Of consideration, ovarian torsion, IBD, PID, and cystitis, as well as additional urinary pathology were of concern, but given her presentation, history and physical exam accompanied by her current lab values, concerning pathology is unlikely. Patient has had a significant evaluation in the past several years with a CT Abdomin as recently as 2015 that was unremarkable.   7:45 AM, Patient noted to be resting in bed. She continually shifts from her side to her back in an attempt to relieve the pain when the physician is present in the room. Other than tachypnea, her vitals are within normal limits, although she does present with a wide pulse pressure ranging from 50-52mmHg. Reviewed CBC, and lipase which remained unremarkable. CMP demonstrated mild hypocapnia which  is consistent with tachypnea. Pelvic exam indicated due to severe suprapubic pain without complaints of dysuria.  8:40 AM, Patient prepared for pelvic exam.  8:55 AM, Pelvic performed, patient denied pain with exam, tolerated it well, was not concerned with severe ovarian pain. PID removed from differential but labs still pending. Moderate quantity of thick off/white vaginal discharge and mild odor was present. No evidence of erythema of the cervix or in the vaginal vault.   10:00 AM, Patient returned from [place], awaiting, interpretation. Patient continues to request pain medication but is found resting comfortably in her bed upon entering the room each time. Will continue to monitor.  11:12 AM, Patient continues to be concerned of pain. Toradol 15mg  given. [place] remarkable for uterine fibroids but not ovarian torsion or other emergent pathology.  11:59 AM, Patient's wet prep and  GC/Chlamydia unable to be processed as per the lab. Given the patients presentation, BV is most likely. Will discharge with flagyl 500mg  for seven days, and ondansetron 4mg  for four days as needed for nausea. Patient advised to take ibuprofen and tylenol as needed for pain as per the manufactures recommendations on the bottle. She was in good condition and stable for discharge upon release.   Patient was seen and evaluated with Dr. Ashok Cordia.  Final Clinical Impressions(s) / ED Diagnoses   Final diagnoses:  Lower abdominal pain  Pelvic pain    New Prescriptions Discharge Medication List as of 12/05/2016 12:01 PM    START taking these medications   Details  ondansetron (ZOFRAN) 4 MG tablet Take 1 tablet (4 mg total) by mouth every 8 (eight) hours as needed for nausea or vomiting., Starting Mon 12/05/2016, Print         Kathi Ludwig, MD 12/06/16 1245    Lajean Saver, MD 12/06/16 [phone]

## 2016-12-05 NOTE — Discharge Instructions (Signed)
Please schedule an appointment with a primary care physician as soon as possible for follow-up of your recent emergency department visit and chronic conditions.  If you develop bloody bowel movements, blood in your vomit, severely worsening pain, fever, continued nausea that fails to resolve please return to the ED.  You have been prescribed 7 days of metronidazole 500 to be taken twice daily. In addition you have been prescribed zofran for your nausea to be taken as prescribed. You may take Ibuprofen and tylenol as needed for pain according to the manufactures recommendations.    Thank you for visit to the [facility] ED.

## 2016-12-05 NOTE — ED Triage Notes (Signed)
Pt to ED for lower abdominal pain that started last night. Pt has had 4 episodes of emesis in last 24 hours. Denies having any diarrhea. No meds PTA.

## 2016-12-05 NOTE — ED Notes (Signed)
Patient transported to Ultrasound 

## 2016-12-05 NOTE — ED Notes (Signed)
Pt crying out in waiting room "i'm in pain" continuously when a staff member walks by.

## 2016-12-06 LAB — GC/CHLAMYDIA PROBE AMP (~~LOC~~) NOT AT ARMC
Chlamydia: NEGATIVE
NEISSERIA GONORRHEA: NEGATIVE

## 2017-01-30 DIAGNOSIS — M1731 Unilateral post-traumatic osteoarthritis, right knee: Secondary | ICD-10-CM | POA: Diagnosis not present

## 2017-03-07 DIAGNOSIS — R030 Elevated blood-pressure reading, without diagnosis of hypertension: Secondary | ICD-10-CM | POA: Diagnosis not present

## 2017-03-07 DIAGNOSIS — M1731 Unilateral post-traumatic osteoarthritis, right knee: Secondary | ICD-10-CM | POA: Diagnosis not present

## 2017-03-07 DIAGNOSIS — M25561 Pain in right knee: Secondary | ICD-10-CM | POA: Diagnosis not present

## 2017-05-03 ENCOUNTER — Emergency Department (HOSPITAL_COMMUNITY): Payer: Medicare HMO

## 2017-05-03 ENCOUNTER — Encounter (HOSPITAL_COMMUNITY): Payer: Self-pay | Admitting: Emergency Medicine

## 2017-05-03 ENCOUNTER — Other Ambulatory Visit: Payer: Self-pay

## 2017-05-03 ENCOUNTER — Emergency Department (HOSPITAL_COMMUNITY)
Admission: EM | Admit: 2017-05-03 | Discharge: 2017-05-03 | Disposition: A | Discharge status: Home / Self Care | Payer: Medicare HMO | Attending: Emergency Medicine | Admitting: Emergency Medicine

## 2017-05-03 DIAGNOSIS — Y999 Unspecified external cause status: Secondary | ICD-10-CM | POA: Insufficient documentation

## 2017-05-03 DIAGNOSIS — Z79899 Other long term (current) drug therapy: Secondary | ICD-10-CM | POA: Diagnosis not present

## 2017-05-03 DIAGNOSIS — Y939 Activity, unspecified: Secondary | ICD-10-CM | POA: Insufficient documentation

## 2017-05-03 DIAGNOSIS — Y929 Unspecified place or not applicable: Secondary | ICD-10-CM | POA: Diagnosis not present

## 2017-05-03 DIAGNOSIS — M25512 Pain in left shoulder: Secondary | ICD-10-CM | POA: Diagnosis not present

## 2017-05-03 DIAGNOSIS — S8002XA Contusion of left knee, initial encounter: Secondary | ICD-10-CM | POA: Diagnosis not present

## 2017-05-03 DIAGNOSIS — F17200 Nicotine dependence, unspecified, uncomplicated: Secondary | ICD-10-CM | POA: Diagnosis not present

## 2017-05-03 DIAGNOSIS — S8992XA Unspecified injury of left lower leg, initial encounter: Secondary | ICD-10-CM | POA: Diagnosis present
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
## 2017-05-03 MED ORDER — NAPROXEN 500 MG PO TABS: 500.0000 mg | ORAL_TABLET | Freq: Two times a day (BID) | ORAL | 0 refills | Status: AC

## 2017-05-03 MED ORDER — KETOROLAC TROMETHAMINE 30 MG/ML IJ SOLN
30.0000 mg | Freq: Once | INTRAMUSCULAR | Status: AC
  Administered 2017-05-03: 30 mg via INTRAMUSCULAR
  Filled 2017-05-03: qty 1

## 2017-05-03 MED ORDER — CYCLOBENZAPRINE HCL 5 MG PO TABS: 10.0000 mg | ORAL_TABLET | Freq: Two times a day (BID) | ORAL | 0 refills | Status: AC | PRN

## 2017-05-03 MED ORDER — HYDROCODONE-ACETAMINOPHEN 5-325 MG PO TABS
1.0000 | ORAL_TABLET | Freq: Once | ORAL | Status: AC
  Administered 2017-05-03: 1 via ORAL
  Filled 2017-05-03: qty 1

## 2017-05-03 NOTE — ED Notes (Signed)
Pt tx to xray
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
## 2017-05-03 NOTE — ED Notes (Signed)
ED Provider at bedside. 
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
## 2017-05-25 DIAGNOSIS — Z5181 Encounter for therapeutic drug level monitoring: Secondary | ICD-10-CM | POA: Diagnosis not present

## 2017-05-25 DIAGNOSIS — Z6838 Body mass index (BMI) 38.0-38.9, adult: Secondary | ICD-10-CM | POA: Diagnosis not present

## 2017-05-25 DIAGNOSIS — R030 Elevated blood-pressure reading, without diagnosis of hypertension: Secondary | ICD-10-CM | POA: Diagnosis not present

## 2017-05-25 DIAGNOSIS — M1731 Unilateral post-traumatic osteoarthritis, right knee: Secondary | ICD-10-CM | POA: Diagnosis not present

## 2017-05-25 DIAGNOSIS — Z79899 Other long term (current) drug therapy: Secondary | ICD-10-CM | POA: Diagnosis not present

## 2017-05-25 DIAGNOSIS — Z79891 Long term (current) use of opiate analgesic: Secondary | ICD-10-CM | POA: Diagnosis not present

## 2017-05-25 DIAGNOSIS — M25561 Pain in right knee: Secondary | ICD-10-CM | POA: Diagnosis not present
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
## 2018-09-17 ENCOUNTER — Other Ambulatory Visit: Payer: Self-pay | Admitting: Nurse Practitioner

## 2018-09-17 DIAGNOSIS — Z1231 Encounter for screening mammogram for malignant neoplasm of breast: Secondary | ICD-10-CM

## 2019-02-14 ENCOUNTER — Other Ambulatory Visit: Payer: Self-pay

## 2019-02-14 DIAGNOSIS — Z20822 Contact with and (suspected) exposure to covid-19: Secondary | ICD-10-CM

## 2019-02-15 LAB — NOVEL CORONAVIRUS, NAA: SARS-CoV-2, NAA: NOT DETECTED

## 2019-05-03 DIAGNOSIS — F17210 Nicotine dependence, cigarettes, uncomplicated: Secondary | ICD-10-CM | POA: Diagnosis not present

## 2019-05-03 DIAGNOSIS — M25561 Pain in right knee: Secondary | ICD-10-CM | POA: Diagnosis not present

## 2019-05-03 DIAGNOSIS — M545 Low back pain: Secondary | ICD-10-CM | POA: Diagnosis not present

## 2019-05-03 DIAGNOSIS — Z79899 Other long term (current) drug therapy: Secondary | ICD-10-CM | POA: Diagnosis not present

## 2019-05-03 DIAGNOSIS — M47817 Spondylosis without myelopathy or radiculopathy, lumbosacral region: Secondary | ICD-10-CM | POA: Diagnosis not present

## 2019-05-03 DIAGNOSIS — G8929 Other chronic pain: Secondary | ICD-10-CM | POA: Diagnosis not present

## 2021-04-19 ENCOUNTER — Ambulatory Visit: Admission: EM | Admit: 2021-04-19 | Discharge: 2021-04-19 | Discharge status: Absent without leave | Payer: Medicare HMO

## 2022-10-04 ENCOUNTER — Other Ambulatory Visit: Payer: Self-pay | Admitting: Family Medicine

## 2022-10-04 DIAGNOSIS — Z1231 Encounter for screening mammogram for malignant neoplasm of breast: Secondary | ICD-10-CM

## 2022-10-26 ENCOUNTER — Ambulatory Visit: Payer: Medicare HMO
